# Patient Record
Sex: Female | Born: 1950 | Race: White | Hispanic: No | Marital: Married | Smoking: Never smoker
Health system: Southern US, Community
[De-identification: ages and names within clinical notes are randomized; demographics above are authoritative.]

## PROBLEM LIST (undated history)

## (undated) DIAGNOSIS — N809 Endometriosis, unspecified: Secondary | ICD-10-CM

## (undated) DIAGNOSIS — M5136 Other intervertebral disc degeneration, lumbar region: Secondary | ICD-10-CM

## (undated) DIAGNOSIS — R7303 Prediabetes: Secondary | ICD-10-CM

## (undated) DIAGNOSIS — F419 Anxiety disorder, unspecified: Secondary | ICD-10-CM

## (undated) DIAGNOSIS — M51369 Other intervertebral disc degeneration, lumbar region without mention of lumbar back pain or lower extremity pain: Secondary | ICD-10-CM

## (undated) DIAGNOSIS — F329 Major depressive disorder, single episode, unspecified: Secondary | ICD-10-CM

## (undated) DIAGNOSIS — Z9889 Other specified postprocedural states: Secondary | ICD-10-CM

## (undated) DIAGNOSIS — G588 Other specified mononeuropathies: Secondary | ICD-10-CM

## (undated) DIAGNOSIS — K219 Gastro-esophageal reflux disease without esophagitis: Secondary | ICD-10-CM

## (undated) DIAGNOSIS — M1990 Unspecified osteoarthritis, unspecified site: Secondary | ICD-10-CM

## (undated) DIAGNOSIS — F32A Depression, unspecified: Secondary | ICD-10-CM

## (undated) DIAGNOSIS — R112 Nausea with vomiting, unspecified: Secondary | ICD-10-CM

## (undated) DIAGNOSIS — G709 Myoneural disorder, unspecified: Secondary | ICD-10-CM

## (undated) DIAGNOSIS — J302 Other seasonal allergic rhinitis: Secondary | ICD-10-CM

## (undated) DIAGNOSIS — E119 Type 2 diabetes mellitus without complications: Secondary | ICD-10-CM

## (undated) DIAGNOSIS — I10 Essential (primary) hypertension: Secondary | ICD-10-CM

## (undated) HISTORY — PX: BLADDER SURGERY: SHX569

## (undated) HISTORY — PX: ABDOMINAL HYSTERECTOMY: SHX81

## (undated) HISTORY — DX: Type 2 diabetes mellitus without complications: E11.9

## (undated) HISTORY — PX: COLONOSCOPY: SHX5424

## (undated) HISTORY — PX: BACK SURGERY: SHX140

## (undated) HISTORY — PX: CHOLECYSTECTOMY: SHX55

## (undated) HISTORY — PX: TOOTH EXTRACTION: SUR596

---

## 1997-11-09 ENCOUNTER — Ambulatory Visit (HOSPITAL_COMMUNITY): Admission: RE | Admit: 1997-11-09 | Discharge: 1997-11-09 | Payer: Self-pay | Admitting: Obstetrics and Gynecology

## 2007-03-10 HISTORY — PX: NERVE SURGERY: SHX1016

## 2017-05-11 LAB — COLOGUARD: Cologuard: POSITIVE — AB

## 2017-05-26 LAB — HM COLONOSCOPY

## 2018-04-14 ENCOUNTER — Other Ambulatory Visit: Payer: Self-pay | Admitting: Orthopedic Surgery

## 2018-04-15 NOTE — Pre-Procedure Instructions (Signed)
LASKYat C Diperna  04/15/2018      [facility] - [place], [place] - [address] [place] [id number] Phone: [phone] Fax: [phone]    Your procedure is scheduled on Wednesday, February 3rd.  [facility] Admitting at 6:30 A.M.  Call this number if you have problems the morning of surgery:  [phone]   Remember:  Do not eat or drink after midnight.    Take these medicines the morning of surgery with A SIP OF WATER  DULoxetine (CYMBALTA)  gabapentin (NEURONTIN) lansoprazole (PREVACID)-if needed loratadine (CLARITIN)  tiZANidine (ZANAFLEX)-if needed.   Follow your surgeon's instructions on when to stop Asprin.  If no instructions were given by your surgeon then you will need to call the office to get those instructions.    As of today, STOP taking any Aleve, Naproxen, Ibuprofen, Motrin, Advil, Goody's, BC's, all herbal medications, fish oil, and all vitamins.   Do not wear jewelry, make-up or nail polish.  Do not wear lotions, powders, or perfumes, or deodorant.  Do not shave 48 hours prior to surgery.  Men may shave face and neck.  Do not bring valuables to the hospital.  [facility] is not responsible for any belongings or valuables.  Contacts, dentures or bridgework may not be worn into surgery.  Leave your suitcase in the car.  After surgery it may be brought to your room.  For patients admitted to the hospital, discharge time will be determined by your treatment team.  Patients discharged the day of surgery will not be allowed to drive home.   Special instructions:   [place]- Preparing For Surgery  Before surgery, you can play an important role. Because skin is not sterile, your skin needs to be as free of germs as possible. You can reduce the number of germs on your skin by washing with CHG (chlorahexidine gluconate) Soap before surgery.  CHG is an antiseptic cleaner which kills germs and bonds with the  skin to continue killing germs even after washing.    Oral Hygiene is also important to reduce your risk of infection.  Remember - BRUSH YOUR TEETH THE MORNING OF SURGERY WITH YOUR REGULAR TOOTHPASTE  Please do not use if you have an allergy to CHG or antibacterial soaps. If your skin becomes reddened/irritated stop using the CHG.  Do not shave (including legs and underarms) for at least 48 hours prior to first CHG shower. It is OK to shave your face.  Please follow these instructions carefully.   1. Shower the NIGHT BEFORE SURGERY and the MORNING OF SURGERY with CHG.   2. If you chose to wash your hair, wash your hair first as usual with your normal shampoo.  3. After you shampoo, rinse your hair and body thoroughly to remove the shampoo.  4. Use CHG as you would any other liquid soap. You can apply CHG directly to the skin and wash gently with a scrungie or a clean washcloth.   5. Apply the CHG Soap to your body ONLY FROM THE NECK DOWN.  Do not use on open wounds or open sores. Avoid contact with your eyes, ears, mouth and genitals (private parts). Wash Face and genitals (private parts)  with your normal soap.  6. Wash thoroughly, paying special attention to the area where your surgery will be performed.  7. Thoroughly rinse your body with warm water from the neck down.  8. DO NOT shower/wash  with your normal soap after using and rinsing off the CHG Soap.  9. Farrah yourself dry with a CLEAN TOWEL.  10. Wear CLEAN PAJAMAS to bed the night before surgery, wear comfortable clothes the morning of surgery  11. Place CLEAN SHEETS on your bed the night of your first shower and DO NOT SLEEP WITH PETS.    Day of Surgery:  Do not apply any deodorants/lotions.  Please wear clean clothes to the hospital/surgery center.   Remember to brush your teeth WITH YOUR REGULAR TOOTHPASTE.   Please read over the following fact sheets that you were given.

## 2018-04-18 ENCOUNTER — Other Ambulatory Visit: Payer: Self-pay

## 2018-04-18 ENCOUNTER — Encounter (HOSPITAL_COMMUNITY): Payer: Self-pay | Admitting: Anesthesiology

## 2018-04-18 ENCOUNTER — Encounter (HOSPITAL_COMMUNITY)
Admission: RE | Admit: 2018-04-18 | Discharge: 2018-04-18 | Disposition: A | Discharge status: Home / Self Care | Payer: Medicare Other | Source: Ambulatory Visit | Attending: Orthopedic Surgery | Admitting: Orthopedic Surgery

## 2018-04-18 ENCOUNTER — Encounter (HOSPITAL_COMMUNITY): Payer: Self-pay | Admitting: *Deleted

## 2018-04-18 ENCOUNTER — Encounter (HOSPITAL_COMMUNITY): Payer: Self-pay | Admitting: Vascular Surgery

## 2018-04-18 DIAGNOSIS — Z01818 Encounter for other preprocedural examination: Secondary | ICD-10-CM | POA: Insufficient documentation

## 2018-04-18 HISTORY — DX: Other seasonal allergic rhinitis: J30.2

## 2018-04-18 HISTORY — DX: Anxiety disorder, unspecified: F41.9

## 2018-04-18 HISTORY — DX: Prediabetes: R73.03

## 2018-04-18 HISTORY — DX: Myoneural disorder, unspecified: G70.9

## 2018-04-18 HISTORY — DX: Endometriosis, unspecified: N80.9

## 2018-04-18 HISTORY — DX: Gastro-esophageal reflux disease without esophagitis: K21.9

## 2018-04-18 HISTORY — DX: Other specified postprocedural states: Z98.890

## 2018-04-18 HISTORY — DX: Unspecified osteoarthritis, unspecified site: M19.90

## 2018-04-18 HISTORY — DX: Nausea with vomiting, unspecified: R11.2

## 2018-04-18 HISTORY — DX: Depression, unspecified: F32.A

## 2018-04-18 HISTORY — DX: Essential (primary) hypertension: I10

## 2018-04-18 HISTORY — DX: Major depressive disorder, single episode, unspecified: F32.9

## 2018-04-18 LAB — COMPREHENSIVE METABOLIC PANEL
ALT: 19 U/L (ref 0–44)
ANION GAP: 9 (ref 5–15)
AST: 28 U/L (ref 15–41)
Albumin: 4.2 g/dL (ref 3.5–5.0)
Alkaline Phosphatase: 72 U/L (ref 38–126)
BUN: 17 mg/dL (ref 8–23)
CO2: 30 mmol/L (ref 22–32)
Calcium: 9.7 mg/dL (ref 8.9–10.3)
Chloride: 97 mmol/L — ABNORMAL LOW (ref 98–111)
Creatinine, Ser: 1.16 mg/dL — ABNORMAL HIGH (ref 0.44–1.00)
GFR calc Af Amer: 57 mL/min — ABNORMAL LOW (ref >60)
GFR calc non Af Amer: 49 mL/min — ABNORMAL LOW (ref >60)
Glucose, Bld: 117 mg/dL — ABNORMAL HIGH (ref 70–99)
POTASSIUM: 4.2 mmol/L (ref 3.5–5.1)
Sodium: 136 mmol/L (ref 135–145)
Total Bilirubin: 0.7 mg/dL (ref 0.3–1.2)
Total Protein: 7.2 g/dL (ref 6.5–8.1)

## 2018-04-18 LAB — URINALYSIS, ROUTINE W REFLEX MICROSCOPIC
Bilirubin Urine: NEGATIVE
Glucose, UA: NEGATIVE mg/dL
Hgb urine dipstick: NEGATIVE
Ketones, ur: NEGATIVE mg/dL
Leukocytes, UA: NEGATIVE
Nitrite: NEGATIVE
PH: 7 (ref 5.0–8.0)
Protein, ur: NEGATIVE mg/dL
Specific Gravity, Urine: 1.013 (ref 1.005–1.030)

## 2018-04-18 LAB — CBC WITH DIFFERENTIAL/PLATELET
Abs Immature Granulocytes: 0.03 10*3/uL (ref 0.00–0.07)
Basophils Absolute: 0.1 10*3/uL (ref 0.0–0.1)
Basophils Relative: 1 %
EOS ABS: 0.5 10*3/uL (ref 0.0–0.5)
Eosinophils Relative: 4 %
HCT: 48 % — ABNORMAL HIGH (ref 36.0–46.0)
Hemoglobin: 14.6 g/dL (ref 12.0–15.0)
Immature Granulocytes: 0 %
LYMPHS ABS: 2.6 10*3/uL (ref 0.7–4.0)
Lymphocytes Relative: 25 %
MCH: 25.6 pg — ABNORMAL LOW (ref 26.0–34.0)
MCHC: 30.4 g/dL (ref 30.0–36.0)
MCV: 84.1 fL (ref 80.0–100.0)
Monocytes Absolute: 0.8 10*3/uL (ref 0.1–1.0)
Monocytes Relative: 7 %
Neutro Abs: 6.4 10*3/uL (ref 1.7–7.7)
Neutrophils Relative %: 63 %
Platelets: 357 10*3/uL (ref 150–400)
RBC: 5.71 MIL/uL — ABNORMAL HIGH (ref 3.87–5.11)
RDW: 13.8 % (ref 11.5–15.5)
WBC: 10.3 10*3/uL (ref 4.0–10.5)
nRBC: 0 % (ref 0.0–0.2)

## 2018-04-18 LAB — SURGICAL PCR SCREEN
MRSA, PCR: NEGATIVE
STAPHYLOCOCCUS AUREUS: POSITIVE — AB

## 2018-04-18 LAB — TYPE AND SCREEN
ABO/RH(D): A POS
ANTIBODY SCREEN: NEGATIVE

## 2018-04-18 LAB — APTT: aPTT: 35 seconds (ref 24–36)

## 2018-04-18 LAB — PROTIME-INR
INR: 1.03
Prothrombin Time: 13.4 seconds (ref 11.4–15.2)

## 2018-04-18 LAB — HEMOGLOBIN A1C
Hgb A1c MFr Bld: 6.7 % — ABNORMAL HIGH (ref 4.8–5.6)
Mean Plasma Glucose: 145.59 mg/dL

## 2018-04-18 LAB — ABO/RH: ABO/RH(D): A POS

## 2018-04-18 NOTE — Anesthesia Preprocedure Evaluation (Deleted)
Anesthesia Evaluation    Reviewed: Allergy & Precautions, Patient's Chart, lab work & pertinent test results  History of Anesthesia Complications (+) PONV  Airway        Dental   Pulmonary neg pulmonary ROS,           Cardiovascular hypertension, Pt. on medications and Pt. on home beta blockers      Neuro/Psych Anxiety Depression    GI/Hepatic Neg liver ROS, GERD  Medicated,  Endo/Other  negative endocrine ROS  Renal/GU negative Renal ROS     Musculoskeletal  (+) Arthritis ,   Abdominal   Peds  Hematology negative hematology ROS (+)   Anesthesia Other Findings   Reproductive/Obstetrics                            Lab Results  Component Value Date   WBC 10.3 04/18/2018   HGB 14.6 04/18/2018   HCT 48.0 (H) 04/18/2018   MCV 84.1 04/18/2018   PLT 357 04/18/2018   Lab Results  Component Value Date   CREATININE 1.16 (H) 04/18/2018   BUN 17 04/18/2018   NA 136 04/18/2018   K 4.2 04/18/2018   CL 97 (L) 04/18/2018   CO2 30 04/18/2018   Lab Results  Component Value Date   INR 1.03 04/18/2018     Anesthesia Physical Anesthesia Plan  ASA: II  Anesthesia Plan: General   Post-op Pain Management:    Induction: Intravenous  PONV Risk Score and Plan: 4 or greater and Ondansetron, Dexamethasone, Midazolam and Scopolamine patch - Pre-op  Airway Management Planned: Oral ETT  Additional Equipment: None  Intra-op Plan:   Post-operative Plan: Extubation in OR  Informed Consent:   Plan Discussed with: CRNA  Anesthesia Plan Comments: ([place] note written 04/18/2018 by Shonna Chock, PA-C.   Cancelled due to URI symptoms, cannot rule out flu.  )      Anesthesia Quick Evaluation

## 2018-04-18 NOTE — Pre-Procedure Instructions (Signed)
NEVA PRUE  04/18/2018      [facility] - [place], [place] - [address] [place] [address] Phone: [phone] Fax: [phone]    Your procedure is scheduled on Wednesday, February 12rd.  Report to [facility] Admitting at 6:30 A.M.  Call this number if you have problems the morning of surgery:  [phone]   Remember:  Do not eat or drink after midnight.    Take these medicines the morning of surgery with A SIP OF WATER  DULoxetine (CYMBALTA)  gabapentin (NEURONTIN) lansoprazole (PREVACID)-if needed loratadine (CLARITIN)  tiZANidine (ZANAFLEX)-if needed.   Follow your surgeon's instructions on when to stop Asprin.  If no instructions were given by your surgeon then you will need to call the office to get those instructions.    As of today, STOP taking any Aleve, Naproxen, Ibuprofen, Motrin, Advil, Goody's, BC's, all herbal medications, fish oil, and all vitamins.   Do not wear jewelry, make-up or nail polish.  Do not wear lotions, powders, or perfumes, or deodorant.  Do not shave 48 hours prior to surgery.  Men may shave face and neck.  Do not bring valuables to the hospital.  [facility] is not responsible for any belongings or valuables.  Contacts, dentures or bridgework may not be worn into surgery.  Leave your suitcase in the car.  After surgery it may be brought to your room.  For patients admitted to the hospital, discharge time will be determined by your treatment team.  Patients discharged the day of surgery will not be allowed to drive home.   Special instructions:   [place]- Preparing For Surgery  Before surgery, you can play an important role. Because skin is not sterile, your skin needs to be as free of germs as possible. You can reduce the number of germs on your skin by washing with CHG (chlorahexidine gluconate) Soap before surgery.  CHG is an antiseptic cleaner which kills germs and bonds with  the skin to continue killing germs even after washing.    Oral Hygiene is also important to reduce your risk of infection.  Remember - BRUSH YOUR TEETH THE MORNING OF SURGERY WITH YOUR REGULAR TOOTHPASTE  Please do not use if you have an allergy to CHG or antibacterial soaps. If your skin becomes reddened/irritated stop using the CHG.  Do not shave (including legs and underarms) for at least 48 hours prior to first CHG shower. It is OK to shave your face.  Please follow these instructions carefully.   1. Shower the NIGHT BEFORE SURGERY and the MORNING OF SURGERY with CHG.   2. If you chose to wash your hair, wash your hair first as usual with your normal shampoo.  3. After you shampoo, rinse your hair and body thoroughly to remove the shampoo.  4. Use CHG as you would any other liquid soap. You can apply CHG directly to the skin and wash gently with a scrungie or a clean washcloth.   5. Apply the CHG Soap to your body ONLY FROM THE NECK DOWN.  Do not use on open wounds or open sores. Avoid contact with your eyes, ears, mouth and genitals (private parts). Wash Face and genitals (private parts)  with your normal soap.  6. Wash thoroughly, paying special attention to the area where your surgery will be performed.  7. Thoroughly rinse your body with warm water from the neck down.  8. DO NOT shower/wash  with your normal soap after using and rinsing off the CHG Soap.  9. Kyna yourself dry with a CLEAN TOWEL.  10. Wear CLEAN PAJAMAS to bed the night before surgery, wear comfortable clothes the morning of surgery  11. Place CLEAN SHEETS on your bed the night of your first shower and DO NOT SLEEP WITH PETS.    Day of Surgery:  Do not apply any deodorants/lotions.  Please wear clean clothes to the hospital/surgery center.   Remember to brush your teeth WITH YOUR REGULAR TOOTHPASTE.   Please read over the following fact sheets that you were given.

## 2018-04-18 NOTE — Progress Notes (Signed)
PCP - [facility] Cardiologist - denies  Chest x-ray - not needed EKG - 04/18/18 Stress Test - denies ECHO - denies Cardiac Cath - denies  Patient stopped aspirin 7 days ago  Anesthesia review: yes patient c/o cough sinus pressure, sore throat.  Revonda Standard PA to see patient  Patient denies shortness of breath, fever, and chest pain at [place] appointment   Patient verbalized understanding of instructions that were given to them at the [facility] appointment. Patient was also instructed that they will need to review over the [place] instructions again at home before surgery.

## 2018-04-18 NOTE — Progress Notes (Addendum)
Anesthesia [place] Evaluation:  Case:  [id number] Date/Time:  04/20/18 0815   Procedure:  LEFT SIDED LUMBAR 4-5 TRANSFORAMINAL LUMBAR INTERBODY FUSION WITH INSTRUMENTATION AND ALLOGRAFT (Left )   Anesthesia type:  General   Pre-op diagnosis:  High grade spondylolisthesis with severe left-sided neuroforaminal stenosis.   Location:  [facility] OR ROOM 05 / [facility] OR   Surgeon:  Estill Bamberg, MD      DISCUSSION: Patient is a 68 year old female scheduled for the above procedure.   History includes never smoker, post-operative N/V, HTN, pre-diabetes, GERD, pudendal neuralgia (treated with Suboxone). BMI is consistent with obesity. She recently moved from [place].   Patient seen in [place] due to reports of sore throat and intermittent cough since this morning (04/18/18). She has known allergies (on Claritin AM, Zyrtec PM; allergy shot 04/17/18), but husband also with similar symptoms that started on 04/16/18. Neither have had fever. Currently patient with sore throat, clear nasal drainage, and clear sputum with occasional cough. She denied SOB, chest pain, palpitations, edema, syncope. Her Suboxone is on hold for surgery (last dose 04/17/18).   Patient does not appear ill with the exception of posterior pharyngeal erythema. No white exudates. Lungs clear. Heart RRR, no murmur noted. O2 sats 98%. Currently, her symptoms seem most consistent with pharyngitis/URI, likely of viral etiology. She is aware that should her symptoms progress or just feeling poorly in general that surgery may need to be delayed until she feels recovered--and that elective surgery should be delayed if she were to develop, fever, abnormal lung sounds, persistent cough, SOB, hypoxia. While at [place], symptoms more mild, but she will see how she does over the next day. If she is feeling poorly by tomorrow afternoon then she will notify Dr. Marshell Levan office regarding rescheduling her surgery. If symptoms stable/improved then she would be re-evaluated by her  anesthesia team on the day of surgery and definitive plan made at that time. I have discussed this with anesthesiologist Kipp Brood, MD. I spoke with Lupita Leash at Dr. Marshell Levan office, and he is also aware of patient's symptoms.    VS: BP 131/81   Pulse 79   Temp 36.8 C   Resp 20   Ht 5' 4.5" (1.638 m)   Wt 89 kg   SpO2 98%   BMI 33.17 kg/m   PROVIDERS: Sheran Spine, MD is PCP at [facility]. He prescribes her Suboxone, so she continues to see him about every three months.    LABS: Labs reviewed: Acceptable for surgery. (all labs ordered are listed, but only abnormal results are displayed)  Labs Reviewed  SURGICAL PCR SCREEN - Abnormal; Notable for the following components:      Result Value   Staphylococcus aureus POSITIVE (*)    All other components within normal limits  CBC WITH DIFFERENTIAL/PLATELET - Abnormal; Notable for the following components:   RBC 5.71 (*)    HCT 48.0 (*)    MCH 25.6 (*)    All other components within normal limits  COMPREHENSIVE METABOLIC PANEL - Abnormal; Notable for the following components:   Chloride 97 (*)    Glucose, Bld 117 (*)    Creatinine, Ser 1.16 (*)    GFR calc non Af Amer 49 (*)    GFR calc Af Amer 57 (*)    All other components within normal limits  URINALYSIS, ROUTINE W REFLEX MICROSCOPIC - Abnormal; Notable for the following components:   APPearance HAZY (*)    All other components within normal limits  HEMOGLOBIN A1C - Abnormal; Notable for the following components:   Hgb A1c MFr Bld 6.7 (*)    All other components within normal limits  APTT  PROTIME-INR  TYPE AND SCREEN  ABO/RH     IMAGES: MRI L-spine 11/24/17 [facility] Care Everywhere): IMPRESSION: 1. Diffuse lumbar spine spondylosis as described above. 2. At L4-5 there is a mild broad-based disc bulge. Severe bilateral facet arthropathy with ligamentum flavum infolding. Bilateral lateral recess stenosis, left greater than right. No right  foraminal stenosis. Moderate-severe left foraminal stenosis. Mild-moderate spinal stenosis. 3. At L2-3 there is a broad-based disc osteophyte complex. Moderate bilateral facet arthropathy. Bilateral lateral recess stenosis. No left foraminal stenosis. Moderate right foraminal stenosis. Mild spinal canal narrowing.   EKG: 04/18/18: NSR, low voltage QRS.   CV: N/A  Past Medical History:  Diagnosis Date  . Anxiety   . Arthritis   . Depression   . Endometriosis   . GERD (gastroesophageal reflux disease)   . Hypertension   . Neuromuscular disorder (HCC)    neurologia  . PONV (postoperative nausea and vomiting)   . Pre-diabetes   . Seasonal allergies     Past Surgical History:  Procedure Laterality Date  . ABDOMINAL HYSTERECTOMY    . BLADDER SURGERY     to help with UTI  . CHOLECYSTECTOMY    . COLONOSCOPY    . NERVE SURGERY     for her neuralgia - out in [place]  . TOOTH EXTRACTION      MEDICATIONS: . aspirin EC 81 MG tablet  . buprenorphine-naloxone (SUBOXONE) 8-2 mg SUBL SL tablet  . cetirizine (ZYRTEC) 10 MG tablet  . clonazePAM (KLONOPIN) 1 MG tablet  . Coenzyme Q10 (CO Q-10) 200 MG CAPS  . DULoxetine (CYMBALTA) 30 MG capsule  . estradiol (CLIMARA - DOSED IN MG/24 HR) 0.0375 mg/24hr patch  . gabapentin (NEURONTIN) 800 MG tablet  . lansoprazole (PREVACID) 30 MG capsule  . Linoleic Acid-Sunflower Oil (CLA PO)  . lisinopril (PRINIVIL,ZESTRIL) 20 MG tablet  . loratadine (CLARITIN) 10 MG tablet  . Multiple Minerals-Vitamins (CALCIUM-MAGNESIUM-ZINC-D3) TABS  . Multiple Vitamin (MULTIVITAMIN WITH MINERALS) TABS tablet  . naproxen (NAPROSYN) 500 MG tablet  . nebivolol (BYSTOLIC) 10 MG tablet  . rosuvastatin (CRESTOR) 5 MG tablet  . tiZANidine (ZANAFLEX) 2 MG tablet   No current facility-administered medications for this encounter.   ASA on hold for surgery. Last Suboxone 04/17/18.   Shonna Chock, PA-C Surgical Short Stay/Anesthesiology [facility] Phone [phone] [facility] Phone [phone] 04/18/2018 10:23 PM

## 2018-04-19 ENCOUNTER — Encounter (HOSPITAL_COMMUNITY): Payer: Self-pay | Admitting: Anesthesiology

## 2018-04-20 ENCOUNTER — Ambulatory Visit (HOSPITAL_COMMUNITY)
Admission: RE | Admit: 2018-04-20 | Discharge: 2018-04-20 | Disposition: A | Discharge status: Home / Self Care | Payer: Medicare Other | Attending: Orthopedic Surgery | Admitting: Orthopedic Surgery

## 2018-04-20 ENCOUNTER — Encounter (HOSPITAL_COMMUNITY)
Admission: RE | Disposition: A | Discharge status: Home / Self Care | Payer: Self-pay | Source: Home / Self Care | Attending: Orthopedic Surgery

## 2018-04-20 ENCOUNTER — Encounter (HOSPITAL_COMMUNITY): Payer: Self-pay

## 2018-04-20 ENCOUNTER — Other Ambulatory Visit: Payer: Self-pay

## 2018-04-20 DIAGNOSIS — M79605 Pain in left leg: Secondary | ICD-10-CM | POA: Diagnosis present

## 2018-04-20 DIAGNOSIS — Z9049 Acquired absence of other specified parts of digestive tract: Secondary | ICD-10-CM | POA: Insufficient documentation

## 2018-04-20 DIAGNOSIS — I10 Essential (primary) hypertension: Secondary | ICD-10-CM | POA: Diagnosis not present

## 2018-04-20 DIAGNOSIS — Z881 Allergy status to other antibiotic agents status: Secondary | ICD-10-CM | POA: Diagnosis not present

## 2018-04-20 DIAGNOSIS — M48061 Spinal stenosis, lumbar region without neurogenic claudication: Secondary | ICD-10-CM | POA: Insufficient documentation

## 2018-04-20 DIAGNOSIS — Z79899 Other long term (current) drug therapy: Secondary | ICD-10-CM | POA: Insufficient documentation

## 2018-04-20 DIAGNOSIS — F329 Major depressive disorder, single episode, unspecified: Secondary | ICD-10-CM | POA: Diagnosis not present

## 2018-04-20 DIAGNOSIS — M4316 Spondylolisthesis, lumbar region: Secondary | ICD-10-CM | POA: Insufficient documentation

## 2018-04-20 DIAGNOSIS — Z7982 Long term (current) use of aspirin: Secondary | ICD-10-CM | POA: Insufficient documentation

## 2018-04-20 DIAGNOSIS — M1990 Unspecified osteoarthritis, unspecified site: Secondary | ICD-10-CM | POA: Diagnosis not present

## 2018-04-20 DIAGNOSIS — Z885 Allergy status to narcotic agent status: Secondary | ICD-10-CM | POA: Diagnosis not present

## 2018-04-20 DIAGNOSIS — Z888 Allergy status to other drugs, medicaments and biological substances status: Secondary | ICD-10-CM | POA: Insufficient documentation

## 2018-04-20 DIAGNOSIS — K219 Gastro-esophageal reflux disease without esophagitis: Secondary | ICD-10-CM | POA: Insufficient documentation

## 2018-04-20 DIAGNOSIS — F419 Anxiety disorder, unspecified: Secondary | ICD-10-CM | POA: Insufficient documentation

## 2018-04-20 DIAGNOSIS — R7303 Prediabetes: Secondary | ICD-10-CM | POA: Diagnosis not present

## 2018-04-20 LAB — GLUCOSE, CAPILLARY: Glucose-Capillary: 106 mg/dL — ABNORMAL HIGH (ref 70–99)

## 2018-04-20 SURGERY — CANCELLED PROCEDURE

## 2018-04-20 MED ORDER — EPINEPHRINE PF 1 MG/ML IJ SOLN
INTRAMUSCULAR | Status: AC
  Filled 2018-04-20: qty 1

## 2018-04-20 MED ORDER — MIDAZOLAM HCL 2 MG/2ML IJ SOLN
INTRAMUSCULAR | Status: AC
  Filled 2018-04-20: qty 2

## 2018-04-20 MED ORDER — METHYLENE BLUE 0.5 % INJ SOLN
INTRAVENOUS | Status: AC
  Filled 2018-04-20: qty 10

## 2018-04-20 MED ORDER — FENTANYL CITRATE (PF) 250 MCG/5ML IJ SOLN
INTRAMUSCULAR | Status: AC
  Filled 2018-04-20: qty 5

## 2018-04-20 MED ORDER — PROPOFOL 10 MG/ML IV BOLUS
INTRAVENOUS | Status: AC
  Filled 2018-04-20: qty 20

## 2018-04-20 MED ORDER — CEFAZOLIN SODIUM-DEXTROSE 2-4 GM/100ML-% IV SOLN
2.0000 g | INTRAVENOUS | Status: DC
  Filled 2018-04-20: qty 100

## 2018-04-20 MED ORDER — THROMBIN (RECOMBINANT) 20000 UNITS EX SOLR
CUTANEOUS | Status: AC
  Filled 2018-04-20: qty 20000

## 2018-04-20 MED ORDER — POVIDONE-IODINE 7.5 % EX SOLN
Freq: Once | CUTANEOUS | Status: DC
  Filled 2018-04-20: qty 118

## 2018-04-20 MED ORDER — BUPIVACAINE HCL (PF) 0.25 % IJ SOLN
INTRAMUSCULAR | Status: AC
  Filled 2018-04-20: qty 30

## 2018-04-20 SURGICAL SUPPLY — 78 items
APL SKNCLS STERI-STRIP NONHPOA (GAUZE/BANDAGES/DRESSINGS) ×1
BENZOIN TINCTURE PRP APPL 2/3 (GAUZE/BANDAGES/DRESSINGS) ×4 IMPLANT
BLADE CLIPPER SURG (BLADE) IMPLANT
BUR PRESCISION 1.7 ELITE (BURR) ×4 IMPLANT
BUR ROUND FLUTED 5 RND (BURR) ×3 IMPLANT
BUR ROUND FLUTED 5MM RND (BURR) ×1
BUR ROUND PRECISION 4.0 (BURR) IMPLANT
BUR ROUND PRECISION 4.0MM (BURR)
BUR SABER RD CUTTING 3.0 (BURR) IMPLANT
BUR SABER RD CUTTING 3.0MM (BURR)
CARTRIDGE OIL MAESTRO DRILL (MISCELLANEOUS) ×2 IMPLANT
CLOSURE WOUND 1/2 X4 (GAUZE/BANDAGES/DRESSINGS) ×2
CONT SPEC 4OZ CLIKSEAL STRL BL (MISCELLANEOUS) ×4 IMPLANT
COVER MAYO STAND STRL (DRAPES) ×8 IMPLANT
COVER SURGICAL LIGHT HANDLE (MISCELLANEOUS) ×4 IMPLANT
COVER WAND RF STERILE (DRAPES) ×4 IMPLANT
DIFFUSER DRILL AIR PNEUMATIC (MISCELLANEOUS) ×4 IMPLANT
DRAIN CHANNEL 15F RND FF W/TCR (WOUND CARE) IMPLANT
DRAPE C-ARM 42X72 X-RAY (DRAPES) ×4 IMPLANT
DRAPE C-ARMOR (DRAPES) IMPLANT
DRAPE POUCH INSTRU U-SHP 10X18 (DRAPES) ×4 IMPLANT
DRAPE SURG 17X23 STRL (DRAPES) ×16 IMPLANT
DURAPREP 26ML APPLICATOR (WOUND CARE) ×4 IMPLANT
ELECT BLADE 4.0 EZ CLEAN MEGAD (MISCELLANEOUS) ×3
ELECT CAUTERY BLADE 6.4 (BLADE) ×4 IMPLANT
ELECT REM PT RETURN 9FT ADLT (ELECTROSURGICAL) ×3
ELECTRODE BLDE 4.0 EZ CLN MEGD (MISCELLANEOUS) ×2 IMPLANT
ELECTRODE REM PT RTRN 9FT ADLT (ELECTROSURGICAL) ×2 IMPLANT
EVACUATOR SILICONE 100CC (DRAIN) IMPLANT
FILTER STRAW FLUID ASPIR (MISCELLANEOUS) ×4 IMPLANT
GAUZE 4X4 16PLY RFD (DISPOSABLE) ×4 IMPLANT
GAUZE SPONGE 4X4 12PLY STRL (GAUZE/BANDAGES/DRESSINGS) ×4 IMPLANT
GLOVE BIO SURGEON STRL SZ7 (GLOVE) ×4 IMPLANT
GLOVE BIO SURGEON STRL SZ8 (GLOVE) ×4 IMPLANT
GLOVE BIOGEL PI IND STRL 7.0 (GLOVE) ×2 IMPLANT
GLOVE BIOGEL PI IND STRL 8 (GLOVE) ×2 IMPLANT
GLOVE BIOGEL PI INDICATOR 7.0 (GLOVE) ×2
GLOVE BIOGEL PI INDICATOR 8 (GLOVE) ×2
GOWN STRL REUS W/ TWL LRG LVL3 (GOWN DISPOSABLE) ×4 IMPLANT
GOWN STRL REUS W/ TWL XL LVL3 (GOWN DISPOSABLE) ×2 IMPLANT
GOWN STRL REUS W/TWL LRG LVL3 (GOWN DISPOSABLE) ×6
GOWN STRL REUS W/TWL XL LVL3 (GOWN DISPOSABLE) ×3
IV CATH 14GX2 1/4 (CATHETERS) ×4 IMPLANT
KIT BASIN OR (CUSTOM PROCEDURE TRAY) ×4 IMPLANT
KIT POSITION SURG JACKSON T1 (MISCELLANEOUS) ×4 IMPLANT
KIT TURNOVER KIT B (KITS) ×4 IMPLANT
MARKER SKIN DUAL TIP RULER LAB (MISCELLANEOUS) ×8 IMPLANT
NDL HYPO 25GX1X1/2 BEV (NEEDLE) ×1 IMPLANT
NDL SAFETY ECLIPSE 18X1.5 (NEEDLE) ×2 IMPLANT
NDL SPNL 18GX3.5 QUINCKE PK (NEEDLE) ×2 IMPLANT
NEEDLE 22X1 1/2 (OR ONLY) (NEEDLE) ×8 IMPLANT
NEEDLE HYPO 18GX1.5 SHARP (NEEDLE) ×3
NEEDLE HYPO 25GX1X1/2 BEV (NEEDLE) ×3 IMPLANT
NEEDLE SPNL 18GX3.5 QUINCKE PK (NEEDLE) ×6 IMPLANT
NS IRRIG 1000ML POUR BTL (IV SOLUTION) ×4 IMPLANT
OIL CARTRIDGE MAESTRO DRILL (MISCELLANEOUS) ×3
PACK LAMINECTOMY ORTHO (CUSTOM PROCEDURE TRAY) ×4 IMPLANT
PACK UNIVERSAL I (CUSTOM PROCEDURE TRAY) ×4 IMPLANT
PAD ARMBOARD 7.5X6 YLW CONV (MISCELLANEOUS) ×8 IMPLANT
PATTIES SURGICAL .5 X1 (DISPOSABLE) ×4 IMPLANT
PATTIES SURGICAL .5X1.5 (GAUZE/BANDAGES/DRESSINGS) ×4 IMPLANT
SPONGE INTESTINAL PEANUT (DISPOSABLE) ×4 IMPLANT
SPONGE SURGIFOAM ABS GEL 100 (HEMOSTASIS) ×4 IMPLANT
STRIP CLOSURE SKIN 1/2X4 (GAUZE/BANDAGES/DRESSINGS) ×6 IMPLANT
SURGIFLO W/THROMBIN 8M KIT (HEMOSTASIS) IMPLANT
SUT MNCRL AB 4-0 PS2 18 (SUTURE) ×4 IMPLANT
SUT VIC AB 0 CT1 18XCR BRD 8 (SUTURE) ×2 IMPLANT
SUT VIC AB 0 CT1 8-18 (SUTURE) ×3
SUT VIC AB 1 CT1 18XCR BRD 8 (SUTURE) ×2 IMPLANT
SUT VIC AB 1 CT1 8-18 (SUTURE) ×3
SUT VIC AB 2-0 CT2 18 VCP726D (SUTURE) ×4 IMPLANT
SYR 20CC LL (SYRINGE) ×8 IMPLANT
SYR BULB IRRIGATION 50ML (SYRINGE) ×4 IMPLANT
SYR CONTROL 10ML LL (SYRINGE) ×8 IMPLANT
SYR TB 1ML LUER SLIP (SYRINGE) ×4 IMPLANT
TRAY FOLEY MTR SLVR 16FR STAT (SET/KITS/TRAYS/PACK) ×4 IMPLANT
WATER STERILE IRR 1000ML POUR (IV SOLUTION) ×4 IMPLANT
YANKAUER SUCT BULB TIP NO VENT (SUCTIONS) ×4 IMPLANT

## 2018-04-20 NOTE — Progress Notes (Signed)
Patient this morning feels that she may have a head cold, or potentially allergies.  She did have concerns related to anesthesia and proceeding with surgery.  I did talk through her situation with Dr. Hart Rochester.  Although it seems there may not be any firm medical reason to cancel her surgery, given her concerns related to her potential head cold or allergies, we will postpone her surgery to a later date.  She will be discharged from the holding area soon, and my office will be in contact with her with regards to rescheduling her surgery.

## 2018-04-20 NOTE — H&P (Signed)
PREOPERATIVE H&P  Chief Complaint: Left leg pain  HPI: Katie Combs is a 68 y.o. female who presents with ongoing pain in the left leg  MRI reveals severe stenosis at L4/5 with a spondylilisthesis  Patient has failed multiple forms of conservative care and continues to have pain (see office notes for additional details regarding the patient's full course of treatment)  Past Medical History:  Diagnosis Date  . Anxiety   . Arthritis   . Depression   . Endometriosis   . GERD (gastroesophageal reflux disease)   . Hypertension   . Neuromuscular disorder (HCC)    neurologia  . PONV (postoperative nausea and vomiting)   . Pre-diabetes   . Seasonal allergies    Past Surgical History:  Procedure Laterality Date  . ABDOMINAL HYSTERECTOMY    . BLADDER SURGERY     to help with UTI  . CHOLECYSTECTOMY    . COLONOSCOPY    . NERVE SURGERY     for her neuralgia - out in [place]  . TOOTH EXTRACTION     Social History   Socioeconomic History  . Marital status: Married    Spouse name: Not on file  . Number of children: Not on file  . Years of education: Not on file  . Highest education level: Not on file  Occupational History  . Not on file  Social Needs  . Financial resource strain: Not on file  . Food insecurity:    Worry: Not on file    Inability: Not on file  . Transportation needs:    Medical: Not on file    Non-medical: Not on file  Tobacco Use  . Smoking status: Never Smoker  . Smokeless tobacco: Never Used  Substance and Sexual Activity  . Alcohol use: Not Currently  . Drug use: Never  . Sexual activity: Not on file  Lifestyle  . Physical activity:    Days per week: Not on file    Minutes per session: Not on file  . Stress: Not on file  Relationships  . Social connections:    Talks on phone: Not on file    Gets together: Not on file    Attends religious service: Not on file    Active member of club or organization: Not on file    Attends  meetings of clubs or organizations: Not on file    Relationship status: Not on file  Other Topics Concern  . Not on file  Social History Narrative  . Not on file   History reviewed. No pertinent family history. Allergies  Allergen Reactions  . Morphine And Related Anaphylaxis  . Macrodantin [Nitrofurantoin Macrocrystal]     Flu like symptoms  . Other Other (See Comments)    Synthetic Sutures do not heal and had to be removed   . Adhesive [Tape] Rash   Prior to Admission medications   Medication Sig Start Date End Date Taking? Authorizing Provider  aspirin EC 81 MG tablet Take 81 mg by mouth daily.   Yes [provider]  buprenorphine-naloxone (SUBOXONE) 8-2 mg SUBL SL tablet Place 1 tablet under the tongue 2 (two) times daily.   Yes [provider]  cetirizine (ZYRTEC) 10 MG tablet Take 10 mg by mouth at bedtime.   Yes [provider]  clonazePAM (KLONOPIN) 1 MG tablet Take 0.5 mg by mouth at bedtime.   Yes [provider]  Coenzyme Q10 (CO Q-10) 200 MG CAPS Take 200 mg by  mouth at bedtime.   Yes [provider]  DULoxetine (CYMBALTA) 30 MG capsule Take 30 mg by mouth daily.   Yes [provider]  estradiol (CLIMARA - DOSED IN MG/24 HR) 0.0375 mg/24hr patch Place 0.0375 mg onto the skin 2 (two) times a week.   Yes [provider]  gabapentin (NEURONTIN) 800 MG tablet Take 800-1,600 mg by mouth See admin instructions. Take 1600 mg by mouth in the morning and 800 mg at night   Yes [provider]  lansoprazole (PREVACID) 30 MG capsule Take 30 mg by mouth 2 (two) times daily as needed (heartburn).   Yes [provider]  Linoleic Acid-Sunflower Oil (CLA PO) Take 1,400 mg by mouth at bedtime.   Yes [provider]  lisinopril (PRINIVIL,ZESTRIL) 20 MG tablet Take 20 mg by mouth daily.   Yes [provider]  loratadine (CLARITIN) 10 MG tablet Take 10 mg by mouth daily.   Yes [provider]  Multiple Minerals-Vitamins (CALCIUM-MAGNESIUM-ZINC-D3) TABS Take 1 tablet by mouth at bedtime.   Yes [provider]  Multiple Vitamin (MULTIVITAMIN WITH MINERALS) TABS tablet Take 1 tablet by mouth daily.   Yes [provider]  naproxen (NAPROSYN) 500 MG tablet Take 500 mg by mouth 2 (two) times daily as needed for moderate pain.   Yes [provider]  nebivolol (BYSTOLIC) 10 MG tablet Take 5 mg by mouth at bedtime.   Yes [provider]  rosuvastatin (CRESTOR) 5 MG tablet Take 2.5 mg by mouth at bedtime.   Yes [provider]  tiZANidine (ZANAFLEX) 2 MG tablet Take 2 mg by mouth 2 (two) times daily as needed for muscle spasms.   Yes [provider]     All other systems have been reviewed and were otherwise negative with the exception of those mentioned in the HPI and as above.  Physical Exam: There were no vitals filed for this visit.  Body mass index is 33.17 kg/m.  General: Alert, no acute distress Cardiovascular: No pedal edema Respiratory: No cyanosis, no use of accessory musculature Skin: No lesions in the area of chief complaint Neurologic: Sensation intact distally Psychiatric: Patient is competent for consent with normal mood and affect Lymphatic: No axillary or cervical lymphadenopathy  MUSCULOSKELETAL: + SLR on the left  Assessment/Plan: L4/5 spondylolisthesis with severe left-sided neuroforaminal stenosis. Plan for Procedure(s): LEFT SIDED LUMBAR 4-5 TRANSFORAMINAL LUMBAR INTERBODY FUSION WITH INSTRUMENTATION AND ALLOGRAFT   Jackelyn HoehnMark L Tomoko Sandra, MD 04/20/2018 7:20 AM

## 2018-04-20 NOTE — Progress Notes (Signed)
Per Dr. Yevette Edwards, Patient does not want to move forward with surgery today due to possible cold.  Patient is to contact Dr. Marshell Levan office to reschedule.  Patient discharged home with husband and family.

## 2018-05-03 ENCOUNTER — Other Ambulatory Visit: Payer: Self-pay | Admitting: Orthopedic Surgery

## 2018-05-10 ENCOUNTER — Encounter (HOSPITAL_COMMUNITY): Payer: Self-pay | Admitting: *Deleted

## 2018-05-10 ENCOUNTER — Other Ambulatory Visit: Payer: Self-pay

## 2018-05-11 ENCOUNTER — Encounter (HOSPITAL_COMMUNITY): Payer: Self-pay | Admitting: Anesthesiology

## 2018-05-11 NOTE — Anesthesia Preprocedure Evaluation (Addendum)
Anesthesia Evaluation  Patient identified by MRN, date of birth, ID band Patient awake    Reviewed: Allergy & Precautions, NPO status , Patient's Chart, lab work & pertinent test results  History of Anesthesia Complications (+) PONV  Airway Mallampati: I  TM Distance: >3 FB Neck ROM: Full    Dental  (+) Teeth Intact, Dental Advisory Given   Pulmonary neg pulmonary ROS,    breath sounds clear to auscultation       Cardiovascular hypertension, Pt. on medications and Pt. on home beta blockers  Rhythm:Regular Rate:Normal     Neuro/Psych Anxiety Depression    GI/Hepatic Neg liver ROS, GERD  ,  Endo/Other  negative endocrine ROS  Renal/GU negative Renal ROS     Musculoskeletal  (+) Arthritis ,   Abdominal (+) + obese,   Peds  Hematology negative hematology ROS (+)   Anesthesia Other Findings   Reproductive/Obstetrics                            Anesthesia Physical Anesthesia Plan  ASA: II  Anesthesia Plan: General   Post-op Pain Management:    Induction: Intravenous  PONV Risk Score and Plan: 4 or greater and Ondansetron, Dexamethasone, Midazolam and Scopolamine patch - Pre-op  Airway Management Planned: Oral ETT  Additional Equipment: None  Intra-op Plan:   Post-operative Plan: Extubation in OR  Informed Consent: I have reviewed the patients History and Physical, chart, labs and discussed the procedure including the risks, benefits and alternatives for the proposed anesthesia with the patient or authorized representative who has indicated his/her understanding and acceptance.     Dental advisory given  Plan Discussed with: CRNA  Anesthesia Plan Comments:        Anesthesia Quick Evaluation

## 2018-05-12 ENCOUNTER — Inpatient Hospital Stay (HOSPITAL_COMMUNITY): Payer: Medicare Other

## 2018-05-12 ENCOUNTER — Inpatient Hospital Stay (HOSPITAL_COMMUNITY): Payer: Medicare Other | Admitting: Certified Registered Nurse Anesthetist

## 2018-05-12 ENCOUNTER — Inpatient Hospital Stay (HOSPITAL_COMMUNITY)
Admission: RE | Admit: 2018-05-12 | Discharge: 2018-05-13 | DRG: 455 | Disposition: A | Discharge status: Home / Self Care | Payer: Medicare Other | Attending: Orthopedic Surgery | Admitting: Orthopedic Surgery

## 2018-05-12 ENCOUNTER — Other Ambulatory Visit: Payer: Self-pay

## 2018-05-12 ENCOUNTER — Inpatient Hospital Stay (HOSPITAL_COMMUNITY)
Admission: RE | Disposition: A | Discharge status: Home / Self Care | Payer: Self-pay | Source: Home / Self Care | Attending: Orthopedic Surgery

## 2018-05-12 ENCOUNTER — Encounter (HOSPITAL_COMMUNITY): Payer: Self-pay | Admitting: *Deleted

## 2018-05-12 DIAGNOSIS — Z888 Allergy status to other drugs, medicaments and biological substances status: Secondary | ICD-10-CM

## 2018-05-12 DIAGNOSIS — J302 Other seasonal allergic rhinitis: Secondary | ICD-10-CM | POA: Diagnosis present

## 2018-05-12 DIAGNOSIS — Z9049 Acquired absence of other specified parts of digestive tract: Secondary | ICD-10-CM

## 2018-05-12 DIAGNOSIS — M48061 Spinal stenosis, lumbar region without neurogenic claudication: Secondary | ICD-10-CM | POA: Diagnosis present

## 2018-05-12 DIAGNOSIS — Z90710 Acquired absence of both cervix and uterus: Secondary | ICD-10-CM

## 2018-05-12 DIAGNOSIS — M5416 Radiculopathy, lumbar region: Secondary | ICD-10-CM | POA: Diagnosis present

## 2018-05-12 DIAGNOSIS — Z885 Allergy status to narcotic agent status: Secondary | ICD-10-CM | POA: Diagnosis not present

## 2018-05-12 DIAGNOSIS — K08409 Partial loss of teeth, unspecified cause, unspecified class: Secondary | ICD-10-CM | POA: Diagnosis present

## 2018-05-12 DIAGNOSIS — Z87440 Personal history of urinary (tract) infections: Secondary | ICD-10-CM | POA: Diagnosis not present

## 2018-05-12 DIAGNOSIS — M792 Neuralgia and neuritis, unspecified: Secondary | ICD-10-CM | POA: Diagnosis present

## 2018-05-12 DIAGNOSIS — F329 Major depressive disorder, single episode, unspecified: Secondary | ICD-10-CM | POA: Diagnosis present

## 2018-05-12 DIAGNOSIS — Z79890 Hormone replacement therapy (postmenopausal): Secondary | ICD-10-CM | POA: Diagnosis not present

## 2018-05-12 DIAGNOSIS — Z419 Encounter for procedure for purposes other than remedying health state, unspecified: Secondary | ICD-10-CM

## 2018-05-12 DIAGNOSIS — M5410 Radiculopathy, site unspecified: Secondary | ICD-10-CM | POA: Diagnosis present

## 2018-05-12 DIAGNOSIS — M1990 Unspecified osteoarthritis, unspecified site: Secondary | ICD-10-CM | POA: Diagnosis present

## 2018-05-12 DIAGNOSIS — Z87892 Personal history of anaphylaxis: Secondary | ICD-10-CM | POA: Diagnosis not present

## 2018-05-12 DIAGNOSIS — Z79899 Other long term (current) drug therapy: Secondary | ICD-10-CM

## 2018-05-12 DIAGNOSIS — K219 Gastro-esophageal reflux disease without esophagitis: Secondary | ICD-10-CM | POA: Diagnosis present

## 2018-05-12 DIAGNOSIS — Z6833 Body mass index (BMI) 33.0-33.9, adult: Secondary | ICD-10-CM | POA: Diagnosis not present

## 2018-05-12 DIAGNOSIS — R7303 Prediabetes: Secondary | ICD-10-CM | POA: Diagnosis present

## 2018-05-12 DIAGNOSIS — I10 Essential (primary) hypertension: Secondary | ICD-10-CM | POA: Diagnosis present

## 2018-05-12 DIAGNOSIS — Z7982 Long term (current) use of aspirin: Secondary | ICD-10-CM | POA: Diagnosis not present

## 2018-05-12 DIAGNOSIS — Z91048 Other nonmedicinal substance allergy status: Secondary | ICD-10-CM

## 2018-05-12 DIAGNOSIS — M4316 Spondylolisthesis, lumbar region: Secondary | ICD-10-CM | POA: Diagnosis present

## 2018-05-12 DIAGNOSIS — F419 Anxiety disorder, unspecified: Secondary | ICD-10-CM | POA: Diagnosis present

## 2018-05-12 DIAGNOSIS — E669 Obesity, unspecified: Secondary | ICD-10-CM | POA: Diagnosis present

## 2018-05-12 HISTORY — DX: Other intervertebral disc degeneration, lumbar region: M51.36

## 2018-05-12 HISTORY — PX: TRANSFORAMINAL LUMBAR INTERBODY FUSION (TLIF) WITH PEDICLE SCREW FIXATION 1 LEVEL: SHX6141

## 2018-05-12 HISTORY — DX: Other intervertebral disc degeneration, lumbar region without mention of lumbar back pain or lower extremity pain: M51.369

## 2018-05-12 LAB — CBC WITH DIFFERENTIAL/PLATELET
Abs Immature Granulocytes: 0.04 10*3/uL (ref 0.00–0.07)
Basophils Absolute: 0.1 10*3/uL (ref 0.0–0.1)
Basophils Relative: 1 %
Eosinophils Absolute: 0.3 10*3/uL (ref 0.0–0.5)
Eosinophils Relative: 3 %
HCT: 46.8 % — ABNORMAL HIGH (ref 36.0–46.0)
Hemoglobin: 14.5 g/dL (ref 12.0–15.0)
IMMATURE GRANULOCYTES: 0 %
Lymphocytes Relative: 25 %
Lymphs Abs: 2.8 10*3/uL (ref 0.7–4.0)
MCH: 25.4 pg — ABNORMAL LOW (ref 26.0–34.0)
MCHC: 31 g/dL (ref 30.0–36.0)
MCV: 82.1 fL (ref 80.0–100.0)
Monocytes Absolute: 1 10*3/uL (ref 0.1–1.0)
Monocytes Relative: 9 %
Neutro Abs: 6.7 10*3/uL (ref 1.7–7.7)
Neutrophils Relative %: 62 %
Platelets: 398 10*3/uL (ref 150–400)
RBC: 5.7 MIL/uL — ABNORMAL HIGH (ref 3.87–5.11)
RDW: 13.8 % (ref 11.5–15.5)
WBC: 10.9 10*3/uL — ABNORMAL HIGH (ref 4.0–10.5)
nRBC: 0 % (ref 0.0–0.2)

## 2018-05-12 LAB — GLUCOSE, CAPILLARY
Glucose-Capillary: 111 mg/dL — ABNORMAL HIGH (ref 70–99)
Glucose-Capillary: 147 mg/dL — ABNORMAL HIGH (ref 70–99)

## 2018-05-12 LAB — COMPREHENSIVE METABOLIC PANEL
ALT: 17 U/L (ref 0–44)
AST: 26 U/L (ref 15–41)
Albumin: 3.8 g/dL (ref 3.5–5.0)
Alkaline Phosphatase: 76 U/L (ref 38–126)
Anion gap: 13 (ref 5–15)
BUN: 13 mg/dL (ref 8–23)
CO2: 25 mmol/L (ref 22–32)
Calcium: 9.5 mg/dL (ref 8.9–10.3)
Chloride: 94 mmol/L — ABNORMAL LOW (ref 98–111)
Creatinine, Ser: 0.98 mg/dL (ref 0.44–1.00)
GFR calc Af Amer: >60 mL/min (ref >60)
GFR calc non Af Amer: >60 mL/min (ref >60)
GLUCOSE: 105 mg/dL — AB (ref 70–99)
Potassium: 3.6 mmol/L (ref 3.5–5.1)
Sodium: 132 mmol/L — ABNORMAL LOW (ref 135–145)
Total Bilirubin: 0.4 mg/dL (ref 0.3–1.2)
Total Protein: 6.7 g/dL (ref 6.5–8.1)

## 2018-05-12 LAB — PROTIME-INR
INR: 1.1 (ref 0.8–1.2)
Prothrombin Time: 13.6 seconds (ref 11.4–15.2)

## 2018-05-12 LAB — APTT: aPTT: 33 seconds (ref 24–36)

## 2018-05-12 LAB — TYPE AND SCREEN
ABO/RH(D): A POS
Antibody Screen: NEGATIVE

## 2018-05-12 SURGERY — TRANSFORAMINAL LUMBAR INTERBODY FUSION (TLIF) WITH PEDICLE SCREW FIXATION 1 LEVEL
Anesthesia: General | Laterality: Left

## 2018-05-12 MED ORDER — METHYLENE BLUE 0.5 % INJ SOLN
INTRAVENOUS | Status: DC | PRN
  Administered 2018-05-12: .05 mL via SUBMUCOSAL

## 2018-05-12 MED ORDER — FENTANYL CITRATE (PF) 250 MCG/5ML IJ SOLN
INTRAMUSCULAR | Status: DC | PRN
  Administered 2018-05-12 (×3): 50 ug via INTRAVENOUS
  Administered 2018-05-12: 100 ug via INTRAVENOUS

## 2018-05-12 MED ORDER — ACETAMINOPHEN 10 MG/ML IV SOLN
1000.0000 mg | Freq: Once | INTRAVENOUS | Status: DC | PRN
  Administered 2018-05-12: 1000 mg via INTRAVENOUS

## 2018-05-12 MED ORDER — ROSUVASTATIN CALCIUM 5 MG PO TABS
2.5000 mg | ORAL_TABLET | Freq: Every day | ORAL | Status: DC
  Administered 2018-05-12: 2.5 mg via ORAL
  Filled 2018-05-12: qty 1

## 2018-05-12 MED ORDER — ZOLPIDEM TARTRATE 5 MG PO TABS: 5.0000 mg | ORAL_TABLET | Freq: Every evening | ORAL | Status: DC | PRN

## 2018-05-12 MED ORDER — SCOPOLAMINE 1 MG/3DAYS TD PT72
MEDICATED_PATCH | TRANSDERMAL | Status: DC | PRN
  Administered 2018-05-12: 1 via TRANSDERMAL

## 2018-05-12 MED ORDER — THROMBIN (RECOMBINANT) 20000 UNITS EX SOLR
CUTANEOUS | Status: AC
  Filled 2018-05-12: qty 20000

## 2018-05-12 MED ORDER — FLEET ENEMA 7-19 GM/118ML RE ENEM: 1.0000 | ENEMA | Freq: Once | RECTAL | Status: DC | PRN

## 2018-05-12 MED ORDER — DULOXETINE HCL 30 MG PO CPEP
30.0000 mg | ORAL_CAPSULE | Freq: Every day | ORAL | Status: DC
  Administered 2018-05-13: 30 mg via ORAL
  Filled 2018-05-12 (×2): qty 1

## 2018-05-12 MED ORDER — PROPOFOL 10 MG/ML IV BOLUS
INTRAVENOUS | Status: AC
  Filled 2018-05-12: qty 20

## 2018-05-12 MED ORDER — LORATADINE 10 MG PO TABS
10.0000 mg | ORAL_TABLET | Freq: Every day | ORAL | Status: DC
  Administered 2018-05-12 – 2018-05-13 (×2): 10 mg via ORAL
  Filled 2018-05-12 (×2): qty 1

## 2018-05-12 MED ORDER — LACTATED RINGERS IV SOLN
INTRAVENOUS | Status: DC | PRN
  Administered 2018-05-12 (×2): via INTRAVENOUS

## 2018-05-12 MED ORDER — MEPERIDINE HCL 50 MG/ML IJ SOLN: 6.2500 mg | INTRAMUSCULAR | Status: DC | PRN

## 2018-05-12 MED ORDER — ESTRADIOL 0.0375 MG/24HR TD PTWK
0.0375 mg | MEDICATED_PATCH | TRANSDERMAL | Status: DC
  Administered 2018-05-12: 0.0375 mg via TRANSDERMAL
  Filled 2018-05-12: qty 1

## 2018-05-12 MED ORDER — PHENYLEPHRINE 40 MCG/ML (10ML) SYRINGE FOR IV PUSH (FOR BLOOD PRESSURE SUPPORT)
PREFILLED_SYRINGE | INTRAVENOUS | Status: AC
  Filled 2018-05-12: qty 10

## 2018-05-12 MED ORDER — HYDROMORPHONE HCL 1 MG/ML IJ SOLN
INTRAMUSCULAR | Status: AC
  Filled 2018-05-12: qty 1

## 2018-05-12 MED ORDER — CEFAZOLIN SODIUM-DEXTROSE 2-4 GM/100ML-% IV SOLN
2.0000 g | Freq: Three times a day (TID) | INTRAVENOUS | Status: AC
  Administered 2018-05-12 (×2): 2 g via INTRAVENOUS
  Filled 2018-05-12 (×2): qty 100

## 2018-05-12 MED ORDER — NEBIVOLOL HCL 5 MG PO TABS
5.0000 mg | ORAL_TABLET | Freq: Every day | ORAL | Status: DC
  Administered 2018-05-12: 5 mg via ORAL
  Filled 2018-05-12: qty 1

## 2018-05-12 MED ORDER — CEFAZOLIN SODIUM-DEXTROSE 2-4 GM/100ML-% IV SOLN
INTRAVENOUS | Status: AC
  Filled 2018-05-12: qty 100

## 2018-05-12 MED ORDER — CEFAZOLIN SODIUM-DEXTROSE 2-4 GM/100ML-% IV SOLN
2.0000 g | INTRAVENOUS | Status: AC
  Administered 2018-05-12: 2 g via INTRAVENOUS

## 2018-05-12 MED ORDER — DEXAMETHASONE SODIUM PHOSPHATE 10 MG/ML IJ SOLN
INTRAMUSCULAR | Status: AC
  Filled 2018-05-12: qty 2

## 2018-05-12 MED ORDER — KETOROLAC TROMETHAMINE 0.5 % OP SOLN
1.0000 [drp] | Freq: Three times a day (TID) | OPHTHALMIC | Status: DC | PRN
  Administered 2018-05-12: 1 [drp] via OPHTHALMIC

## 2018-05-12 MED ORDER — SODIUM CHLORIDE 0.9% FLUSH: 3.0000 mL | Freq: Two times a day (BID) | INTRAVENOUS | Status: DC

## 2018-05-12 MED ORDER — DEXAMETHASONE SODIUM PHOSPHATE 10 MG/ML IJ SOLN
INTRAMUSCULAR | Status: DC | PRN
  Administered 2018-05-12: 10 mg via INTRAVENOUS

## 2018-05-12 MED ORDER — KETOROLAC TROMETHAMINE 0.5 % OP SOLN
OPHTHALMIC | Status: AC
  Filled 2018-05-12: qty 5

## 2018-05-12 MED ORDER — ONDANSETRON HCL 4 MG/2ML IJ SOLN
INTRAMUSCULAR | Status: DC | PRN
  Administered 2018-05-12: 4 mg via INTRAVENOUS

## 2018-05-12 MED ORDER — MIDAZOLAM HCL 5 MG/5ML IJ SOLN
INTRAMUSCULAR | Status: DC | PRN
  Administered 2018-05-12: 2 mg via INTRAVENOUS

## 2018-05-12 MED ORDER — PROPOFOL 10 MG/ML IV BOLUS
INTRAVENOUS | Status: DC | PRN
  Administered 2018-05-12: 130 mg via INTRAVENOUS

## 2018-05-12 MED ORDER — EPHEDRINE 5 MG/ML INJ
INTRAVENOUS | Status: AC
  Filled 2018-05-12: qty 10

## 2018-05-12 MED ORDER — SUCCINYLCHOLINE CHLORIDE 200 MG/10ML IV SOSY
PREFILLED_SYRINGE | INTRAVENOUS | Status: AC
  Filled 2018-05-12: qty 10

## 2018-05-12 MED ORDER — OXYCODONE-ACETAMINOPHEN 5-325 MG PO TABS
1.0000 | ORAL_TABLET | ORAL | Status: DC | PRN
  Administered 2018-05-12 – 2018-05-13 (×3): 2 via ORAL
  Administered 2018-05-13: 1 via ORAL
  Filled 2018-05-12: qty 1
  Filled 2018-05-12 (×3): qty 2

## 2018-05-12 MED ORDER — SENNOSIDES-DOCUSATE SODIUM 8.6-50 MG PO TABS: 1.0000 | ORAL_TABLET | Freq: Every evening | ORAL | Status: DC | PRN

## 2018-05-12 MED ORDER — POTASSIUM CHLORIDE IN NACL 20-0.9 MEQ/L-% IV SOLN: INTRAVENOUS | Status: DC

## 2018-05-12 MED ORDER — PHENYLEPHRINE HCL 10 MG/ML IJ SOLN
INTRAMUSCULAR | Status: DC | PRN
  Administered 2018-05-12: 120 ug via INTRAVENOUS

## 2018-05-12 MED ORDER — ACETAMINOPHEN 325 MG PO TABS: 650.0000 mg | ORAL_TABLET | ORAL | Status: DC | PRN

## 2018-05-12 MED ORDER — SODIUM CHLORIDE 0.9% FLUSH: 3.0000 mL | INTRAVENOUS | Status: DC | PRN

## 2018-05-12 MED ORDER — ACETAMINOPHEN 325 MG PO TABS: 325.0000 mg | ORAL_TABLET | Freq: Once | ORAL | Status: DC

## 2018-05-12 MED ORDER — CALCIUM-MAGNESIUM-ZINC-D3 PO TABS: 1.0000 | ORAL_TABLET | Freq: Every day | ORAL | Status: DC

## 2018-05-12 MED ORDER — PANTOPRAZOLE SODIUM 20 MG PO TBEC
20.0000 mg | DELAYED_RELEASE_TABLET | Freq: Every day | ORAL | Status: DC
  Administered 2018-05-12 – 2018-05-13 (×2): 20 mg via ORAL
  Filled 2018-05-12 (×2): qty 1

## 2018-05-12 MED ORDER — DIAZEPAM 5 MG PO TABS: 5.0000 mg | ORAL_TABLET | Freq: Four times a day (QID) | ORAL | 0 refills | Status: DC | PRN

## 2018-05-12 MED ORDER — FENTANYL CITRATE (PF) 250 MCG/5ML IJ SOLN
INTRAMUSCULAR | Status: AC
  Filled 2018-05-12: qty 5

## 2018-05-12 MED ORDER — METHYLENE BLUE 0.5 % INJ SOLN
INTRAVENOUS | Status: AC
  Filled 2018-05-12: qty 10

## 2018-05-12 MED ORDER — SUGAMMADEX SODIUM 200 MG/2ML IV SOLN
INTRAVENOUS | Status: DC | PRN
  Administered 2018-05-12: 200 mg via INTRAVENOUS

## 2018-05-12 MED ORDER — PHENOL 1.4 % MT LIQD
1.0000 | OROMUCOSAL | Status: DC | PRN
  Filled 2018-05-12: qty 177

## 2018-05-12 MED ORDER — HYDROMORPHONE HCL 1 MG/ML IJ SOLN
0.2500 mg | INTRAMUSCULAR | Status: DC | PRN
  Administered 2018-05-12 (×2): 0.25 mg via INTRAVENOUS
  Administered 2018-05-12: 0.5 mg via INTRAVENOUS

## 2018-05-12 MED ORDER — DIAZEPAM 5 MG PO TABS
5.0000 mg | ORAL_TABLET | Freq: Four times a day (QID) | ORAL | Status: DC | PRN
  Administered 2018-05-12 – 2018-05-13 (×3): 5 mg via ORAL
  Filled 2018-05-12 (×2): qty 1

## 2018-05-12 MED ORDER — 0.9 % SODIUM CHLORIDE (POUR BTL) OPTIME
TOPICAL | Status: DC | PRN
  Administered 2018-05-12 (×2): 1000 mL

## 2018-05-12 MED ORDER — THROMBIN 20000 UNITS EX SOLR
CUTANEOUS | Status: DC | PRN
  Administered 2018-05-12: 20000 [IU] via TOPICAL

## 2018-05-12 MED ORDER — MENTHOL 3 MG MT LOZG: 1.0000 | LOZENGE | OROMUCOSAL | Status: DC | PRN

## 2018-05-12 MED ORDER — ACETAMINOPHEN 160 MG/5ML PO SOLN: 325.0000 mg | Freq: Once | ORAL | Status: DC

## 2018-05-12 MED ORDER — ALUM & MAG HYDROXIDE-SIMETH 200-200-20 MG/5ML PO SUSP: 30.0000 mL | Freq: Four times a day (QID) | ORAL | Status: DC | PRN

## 2018-05-12 MED ORDER — POVIDONE-IODINE 7.5 % EX SOLN: Freq: Once | CUTANEOUS | Status: DC

## 2018-05-12 MED ORDER — BUPIVACAINE-EPINEPHRINE 0.25% -1:200000 IJ SOLN
INTRAMUSCULAR | Status: DC | PRN
  Administered 2018-05-12: 20 mL
  Administered 2018-05-12: 10 mL

## 2018-05-12 MED ORDER — OXYCODONE-ACETAMINOPHEN 5-325 MG PO TABS: 1.0000 | ORAL_TABLET | ORAL | 0 refills | Status: DC | PRN

## 2018-05-12 MED ORDER — LIDOCAINE HCL (CARDIAC) PF 100 MG/5ML IV SOSY
PREFILLED_SYRINGE | INTRAVENOUS | Status: DC | PRN
  Administered 2018-05-12: 40 mg via INTRAVENOUS

## 2018-05-12 MED ORDER — DIAZEPAM 5 MG PO TABS
ORAL_TABLET | ORAL | Status: AC
  Filled 2018-05-12: qty 1

## 2018-05-12 MED ORDER — BUPIVACAINE-EPINEPHRINE (PF) 0.25% -1:200000 IJ SOLN
INTRAMUSCULAR | Status: AC
  Filled 2018-05-12: qty 30

## 2018-05-12 MED ORDER — ROCURONIUM BROMIDE 50 MG/5ML IV SOSY
PREFILLED_SYRINGE | INTRAVENOUS | Status: AC
  Filled 2018-05-12: qty 5

## 2018-05-12 MED ORDER — GABAPENTIN 800 MG PO TABS
1600.0000 mg | ORAL_TABLET | Freq: Every day | ORAL | Status: DC
  Filled 2018-05-12: qty 2

## 2018-05-12 MED ORDER — SODIUM CHLORIDE 0.9 % IV SOLN: 250.0000 mL | INTRAVENOUS | Status: DC

## 2018-05-12 MED ORDER — DIPHENHYDRAMINE HCL 50 MG/ML IJ SOLN
INTRAMUSCULAR | Status: DC | PRN
  Administered 2018-05-12: 12.5 mg via INTRAVENOUS

## 2018-05-12 MED ORDER — LORATADINE 10 MG PO TABS: 10.0000 mg | ORAL_TABLET | Freq: Every day | ORAL | Status: DC

## 2018-05-12 MED ORDER — BISACODYL 5 MG PO TBEC: 5.0000 mg | DELAYED_RELEASE_TABLET | Freq: Every day | ORAL | Status: DC | PRN

## 2018-05-12 MED ORDER — BSS IO SOLN: 15.0000 mL | Freq: Once | INTRAOCULAR | Status: DC

## 2018-05-12 MED ORDER — ONDANSETRON HCL 4 MG/2ML IJ SOLN
INTRAMUSCULAR | Status: AC
  Filled 2018-05-12: qty 2

## 2018-05-12 MED ORDER — ONDANSETRON HCL 4 MG PO TABS: 4.0000 mg | ORAL_TABLET | Freq: Four times a day (QID) | ORAL | Status: DC | PRN

## 2018-05-12 MED ORDER — CALCIUM CARBONATE-VITAMIN D 500-200 MG-UNIT PO TABS
1.0000 | ORAL_TABLET | Freq: Every day | ORAL | Status: DC
  Administered 2018-05-12: 1 via ORAL
  Filled 2018-05-12: qty 1

## 2018-05-12 MED ORDER — DOCUSATE SODIUM 100 MG PO CAPS
100.0000 mg | ORAL_CAPSULE | Freq: Two times a day (BID) | ORAL | Status: DC
  Administered 2018-05-12 – 2018-05-13 (×3): 100 mg via ORAL
  Filled 2018-05-12 (×3): qty 1

## 2018-05-12 MED ORDER — SODIUM CHLORIDE 0.9 % IV SOLN
INTRAVENOUS | Status: DC | PRN
  Administered 2018-05-12: 30 ug/min via INTRAVENOUS

## 2018-05-12 MED ORDER — ACETAMINOPHEN 650 MG RE SUPP: 650.0000 mg | RECTAL | Status: DC | PRN

## 2018-05-12 MED ORDER — GABAPENTIN 400 MG PO CAPS
800.0000 mg | ORAL_CAPSULE | Freq: Every day | ORAL | Status: DC
  Administered 2018-05-12: 800 mg via ORAL
  Filled 2018-05-12: qty 2

## 2018-05-12 MED ORDER — PROMETHAZINE HCL 25 MG/ML IJ SOLN: 6.2500 mg | INTRAMUSCULAR | Status: DC | PRN

## 2018-05-12 MED ORDER — TRIAMTERENE-HCTZ 37.5-25 MG PO TABS
1.0000 | ORAL_TABLET | Freq: Every day | ORAL | Status: DC
  Administered 2018-05-12 – 2018-05-13 (×2): 1 via ORAL
  Filled 2018-05-12 (×2): qty 1

## 2018-05-12 MED ORDER — LISINOPRIL 20 MG PO TABS
20.0000 mg | ORAL_TABLET | Freq: Every day | ORAL | Status: DC
  Administered 2018-05-12 – 2018-05-13 (×2): 20 mg via ORAL
  Filled 2018-05-12 (×2): qty 1

## 2018-05-12 MED ORDER — ACETAMINOPHEN 10 MG/ML IV SOLN
INTRAVENOUS | Status: AC
  Filled 2018-05-12: qty 100

## 2018-05-12 MED ORDER — CLONAZEPAM 0.5 MG PO TABS
0.5000 mg | ORAL_TABLET | Freq: Every day | ORAL | Status: DC
  Administered 2018-05-12: 0.5 mg via ORAL
  Filled 2018-05-12: qty 1

## 2018-05-12 MED ORDER — ROCURONIUM BROMIDE 100 MG/10ML IV SOLN
INTRAVENOUS | Status: DC | PRN
  Administered 2018-05-12: 30 mg via INTRAVENOUS
  Administered 2018-05-12: 50 mg via INTRAVENOUS
  Administered 2018-05-12: 20 mg via INTRAVENOUS

## 2018-05-12 MED ORDER — EPHEDRINE SULFATE 50 MG/ML IJ SOLN
INTRAMUSCULAR | Status: DC | PRN
  Administered 2018-05-12 (×2): 5 mg via INTRAVENOUS

## 2018-05-12 MED ORDER — LACTATED RINGERS IV SOLN: INTRAVENOUS | Status: DC

## 2018-05-12 MED ORDER — PROPOFOL 500 MG/50ML IV EMUL
INTRAVENOUS | Status: DC | PRN
  Administered 2018-05-12: 30 ug/kg/min via INTRAVENOUS

## 2018-05-12 MED ORDER — ONDANSETRON HCL 4 MG/2ML IJ SOLN: 4.0000 mg | Freq: Four times a day (QID) | INTRAMUSCULAR | Status: DC | PRN

## 2018-05-12 MED ORDER — MIDAZOLAM HCL 2 MG/2ML IJ SOLN
INTRAMUSCULAR | Status: AC
  Filled 2018-05-12: qty 2

## 2018-05-12 MED ORDER — BUPIVACAINE LIPOSOME 1.3 % IJ SUSP
20.0000 mL | Freq: Once | INTRAMUSCULAR | Status: DC
  Filled 2018-05-12: qty 20

## 2018-05-12 MED ORDER — LIDOCAINE 2% (20 MG/ML) 5 ML SYRINGE
INTRAMUSCULAR | Status: AC
  Filled 2018-05-12: qty 5

## 2018-05-12 MED ORDER — BUPIVACAINE LIPOSOME 1.3 % IJ SUSP
INTRAMUSCULAR | Status: DC | PRN
  Administered 2018-05-12: 20 mL

## 2018-05-12 SURGICAL SUPPLY — 96 items
APL SKNCLS STERI-STRIP NONHPOA (GAUZE/BANDAGES/DRESSINGS) ×1
BENZOIN TINCTURE PRP APPL 2/3 (GAUZE/BANDAGES/DRESSINGS) ×3 IMPLANT
BLADE CLIPPER SURG (BLADE) ×2 IMPLANT
BUR PRESCISION 1.7 ELITE (BURR) ×3 IMPLANT
BUR ROUND FLUTED 5 RND (BURR) ×2 IMPLANT
BUR ROUND FLUTED 5MM RND (BURR) ×1
BUR ROUND PRECISION 4.0 (BURR) IMPLANT
BUR ROUND PRECISION 4.0MM (BURR)
BUR SABER RD CUTTING 3.0 (BURR) IMPLANT
BUR SABER RD CUTTING 3.0MM (BURR)
CAGE CONCORDE BULLET 11X12X27 (Cage) ×3 IMPLANT
CAGE SPNL 5D BLT NOSE 27X11X12 (Cage) IMPLANT
CARTRIDGE OIL MAESTRO DRILL (MISCELLANEOUS) ×1 IMPLANT
CLOSURE STERI-STRIP 1/2X4 (GAUZE/BANDAGES/DRESSINGS) ×1
CLOSURE WOUND 1/2 X4 (GAUZE/BANDAGES/DRESSINGS) ×2
CLSR STERI-STRIP ANTIMIC 1/2X4 (GAUZE/BANDAGES/DRESSINGS) ×1 IMPLANT
CONT SPEC 4OZ CLIKSEAL STRL BL (MISCELLANEOUS) ×3 IMPLANT
COVER BACK TABLE 60X90IN (DRAPES) ×3 IMPLANT
COVER MAYO STAND STRL (DRAPES) ×6 IMPLANT
COVER SURGICAL LIGHT HANDLE (MISCELLANEOUS) ×3 IMPLANT
COVER WAND RF STERILE (DRAPES) ×3 IMPLANT
DIFFUSER DRILL AIR PNEUMATIC (MISCELLANEOUS) ×3 IMPLANT
DRAIN CHANNEL 15F RND FF W/TCR (WOUND CARE) IMPLANT
DRAPE C-ARM 42X72 X-RAY (DRAPES) ×3 IMPLANT
DRAPE C-ARMOR (DRAPES) IMPLANT
DRAPE POUCH INSTRU U-SHP 10X18 (DRAPES) ×3 IMPLANT
DRAPE SURG 17X23 STRL (DRAPES) ×12 IMPLANT
DURAPREP 26ML APPLICATOR (WOUND CARE) ×3 IMPLANT
ELECT BLADE 4.0 EZ CLEAN MEGAD (MISCELLANEOUS) ×3
ELECT CAUTERY BLADE 6.4 (BLADE) ×3 IMPLANT
ELECT REM PT RETURN 9FT ADLT (ELECTROSURGICAL) ×3
ELECTRODE BLDE 4.0 EZ CLN MEGD (MISCELLANEOUS) ×1 IMPLANT
ELECTRODE REM PT RTRN 9FT ADLT (ELECTROSURGICAL) ×1 IMPLANT
EVACUATOR SILICONE 100CC (DRAIN) IMPLANT
FEE INTRAOP MONITOR IMPULS NCS (MISCELLANEOUS) IMPLANT
FILTER STRAW FLUID ASPIR (MISCELLANEOUS) ×3 IMPLANT
GAUZE 4X4 16PLY RFD (DISPOSABLE) ×3 IMPLANT
GAUZE SPONGE 4X4 12PLY STRL (GAUZE/BANDAGES/DRESSINGS) ×3 IMPLANT
GLOVE BIO SURGEON STRL SZ 6.5 (GLOVE) ×4 IMPLANT
GLOVE BIO SURGEON STRL SZ7 (GLOVE) ×5 IMPLANT
GLOVE BIO SURGEON STRL SZ8 (GLOVE) ×3 IMPLANT
GLOVE BIO SURGEONS STRL SZ 6.5 (GLOVE) ×4
GLOVE BIOGEL PI IND STRL 7.0 (GLOVE) ×1 IMPLANT
GLOVE BIOGEL PI IND STRL 8 (GLOVE) ×1 IMPLANT
GLOVE BIOGEL PI INDICATOR 7.0 (GLOVE) ×2
GLOVE BIOGEL PI INDICATOR 8 (GLOVE) ×2
GLOVE SURG SS PI 7.0 STRL IVOR (GLOVE) ×6 IMPLANT
GOWN STRL REUS W/ TWL LRG LVL3 (GOWN DISPOSABLE) ×2 IMPLANT
GOWN STRL REUS W/ TWL XL LVL3 (GOWN DISPOSABLE) ×1 IMPLANT
GOWN STRL REUS W/TWL LRG LVL3 (GOWN DISPOSABLE) ×9
GOWN STRL REUS W/TWL XL LVL3 (GOWN DISPOSABLE) ×3
INTRAOP MONITOR FEE IMPULS NCS (MISCELLANEOUS) ×1
INTRAOP MONITOR FEE IMPULSE (MISCELLANEOUS) ×2
IV CATH 14GX2 1/4 (CATHETERS) ×3 IMPLANT
KIT BASIN OR (CUSTOM PROCEDURE TRAY) ×3 IMPLANT
KIT POSITION SURG JACKSON T1 (MISCELLANEOUS) ×3 IMPLANT
KIT TURNOVER KIT B (KITS) ×3 IMPLANT
MARKER SKIN DUAL TIP RULER LAB (MISCELLANEOUS) ×6 IMPLANT
MIX DBX 10CC 35% BONE (Bone Implant) ×2 IMPLANT
NDL HYPO 25GX1X1/2 BEV (NEEDLE) ×1 IMPLANT
NDL SAFETY ECLIPSE 18X1.5 (NEEDLE) ×1 IMPLANT
NDL SPNL 18GX3.5 QUINCKE PK (NEEDLE) ×2 IMPLANT
NEEDLE 22X1 1/2 (OR ONLY) (NEEDLE) ×6 IMPLANT
NEEDLE HYPO 18GX1.5 SHARP (NEEDLE) ×3
NEEDLE HYPO 25GX1X1/2 BEV (NEEDLE) ×3 IMPLANT
NEEDLE SPNL 18GX3.5 QUINCKE PK (NEEDLE) ×6 IMPLANT
NS IRRIG 1000ML POUR BTL (IV SOLUTION) ×3 IMPLANT
OIL CARTRIDGE MAESTRO DRILL (MISCELLANEOUS) ×3
PACK LAMINECTOMY ORTHO (CUSTOM PROCEDURE TRAY) ×3 IMPLANT
PACK UNIVERSAL I (CUSTOM PROCEDURE TRAY) ×3 IMPLANT
PAD ARMBOARD 7.5X6 YLW CONV (MISCELLANEOUS) ×6 IMPLANT
PATTIES SURGICAL .5 X1 (DISPOSABLE) ×3 IMPLANT
PATTIES SURGICAL .5X1.5 (GAUZE/BANDAGES/DRESSINGS) ×3 IMPLANT
PUTTY BONE DBX 5CC MIX (Putty) ×2 IMPLANT
ROD PRE BENT EXP 40MM (Rod) ×2 IMPLANT
ROD PRE BENT EXPEDIUM 35MM (Rod) ×2 IMPLANT
SCREW SET SINGLE INNER (Screw) ×8 IMPLANT
SCREW VIPER CORT FIX 6X35 (Screw) ×8 IMPLANT
SPONGE INTESTINAL PEANUT (DISPOSABLE) ×3 IMPLANT
SPONGE SURGIFOAM ABS GEL 100 (HEMOSTASIS) ×3 IMPLANT
STRIP CLOSURE SKIN 1/2X4 (GAUZE/BANDAGES/DRESSINGS) ×4 IMPLANT
SURGIFLO W/THROMBIN 8M KIT (HEMOSTASIS) IMPLANT
SUT MNCRL AB 4-0 PS2 18 (SUTURE) ×3 IMPLANT
SUT VIC AB 0 CT1 18XCR BRD 8 (SUTURE) ×1 IMPLANT
SUT VIC AB 0 CT1 8-18 (SUTURE) ×3
SUT VIC AB 1 CT1 18XCR BRD 8 (SUTURE) ×1 IMPLANT
SUT VIC AB 1 CT1 8-18 (SUTURE) ×3
SUT VIC AB 2-0 CT2 18 VCP726D (SUTURE) ×3 IMPLANT
SYR 20CC LL (SYRINGE) ×6 IMPLANT
SYR BULB IRRIGATION 50ML (SYRINGE) ×3 IMPLANT
SYR CONTROL 10ML LL (SYRINGE) ×6 IMPLANT
SYR TB 1ML LUER SLIP (SYRINGE) ×3 IMPLANT
TAPE CLOTH SURG 4X10 WHT LF (GAUZE/BANDAGES/DRESSINGS) ×2 IMPLANT
TRAY FOLEY MTR SLVR 16FR STAT (SET/KITS/TRAYS/PACK) ×3 IMPLANT
WATER STERILE IRR 1000ML POUR (IV SOLUTION) ×3 IMPLANT
YANKAUER SUCT BULB TIP NO VENT (SUCTIONS) ×3 IMPLANT

## 2018-05-12 NOTE — H&P (Signed)
PREOPERATIVE H&P  Chief Complaint: Left leg pain  HPI: JALEESE Combs is a 68 y.o. female who presents with ongoing pain in the left leg x 1 year  MRI reveals severe NF stenosis at L4/5 with a spondylolisthesis noted at this level  Patient has failed multiple forms of conservative care and continues to have pain (see office notes for additional details regarding the patient's full course of treatment)  Past Medical History:  Diagnosis Date  . Anxiety   . Arthritis   . DDD (degenerative disc disease), lumbar   . Depression   . Endometriosis   . GERD (gastroesophageal reflux disease)   . Hypertension   . Neuromuscular disorder (HCC)    pendulum neuralgia  . PONV (postoperative nausea and vomiting)   . Pre-diabetes   . Seasonal allergies    Past Surgical History:  Procedure Laterality Date  . ABDOMINAL HYSTERECTOMY    . BLADDER SURGERY     to help with UTI  . CHOLECYSTECTOMY    . COLONOSCOPY    . NERVE SURGERY  2009   for her neuralgia - out in [place]  . TOOTH EXTRACTION     Social History   Socioeconomic History  . Marital status: Married    Spouse name: Not on file  . Number of children: Not on file  . Years of education: Not on file  . Highest education level: Not on file  Occupational History  . Not on file  Social Needs  . Financial resource strain: Not on file  . Food insecurity:    Worry: Not on file    Inability: Not on file  . Transportation needs:    Medical: Not on file    Non-medical: Not on file  Tobacco Use  . Smoking status: Never Smoker  . Smokeless tobacco: Never Used  Substance and Sexual Activity  . Alcohol use: Not Currently  . Drug use: Never  . Sexual activity: Not on file  Lifestyle  . Physical activity:    Days per week: Not on file    Minutes per session: Not on file  . Stress: Not on file  Relationships  . Social connections:    Talks on phone: Not on file    Gets together: Not on file    Attends religious  service: Not on file    Active member of club or organization: Not on file    Attends meetings of clubs or organizations: Not on file    Relationship status: Not on file  Other Topics Concern  . Not on file  Social History Narrative  . Not on file   History reviewed. No pertinent family history. Allergies  Allergen Reactions  . Morphine And Related Anaphylaxis  . Macrodantin [Nitrofurantoin Macrocrystal]     Flu like symptoms  . Other Other (See Comments)    Synthetic Sutures do not heal and had to be removed   . Adhesive [Tape] Rash   Prior to Admission medications   Medication Sig Start Date End Date Taking? Authorizing Provider  cetirizine (ZYRTEC) 10 MG tablet Take 10 mg by mouth at bedtime.   Yes [provider]  clonazePAM (KLONOPIN) 1 MG tablet Take 0.5 mg by mouth at bedtime.   Yes [provider]  Coenzyme Q10 (CO Q-10) 200 MG CAPS Take 200 mg by mouth at bedtime.   Yes [provider]  DULoxetine (CYMBALTA) 30 MG capsule Take 30 mg by mouth daily.   Yes [provider]  gabapentin (NEURONTIN) 800 MG tablet Take 800-1,600 mg by mouth See admin instructions. Take 1600 mg by mouth in the morning and 800 mg at night   Yes [provider]  lansoprazole (PREVACID) 30 MG capsule Take 30 mg by mouth 2 (two) times daily as needed (heartburn).   Yes [provider]  Linoleic Acid-Sunflower Oil (CLA PO) Take 1,400 mg by mouth at bedtime.   Yes [provider]  lisinopril (PRINIVIL,ZESTRIL) 20 MG tablet Take 20 mg by mouth daily.   Yes [provider]  loratadine (CLARITIN) 10 MG tablet Take 10 mg by mouth daily.   Yes [provider]  Multiple Minerals-Vitamins (CALCIUM-MAGNESIUM-ZINC-D3) TABS Take 1 tablet by mouth at bedtime.   Yes [provider]  Multiple Vitamin (MULTIVITAMIN WITH MINERALS) TABS tablet Take 1 tablet by mouth daily.   Yes [provider]  naproxen (NAPROSYN) 500 MG  tablet Take 500 mg by mouth 2 (two) times daily as needed for moderate pain.   Yes [provider]  nebivolol (BYSTOLIC) 10 MG tablet Take 5 mg by mouth at bedtime.   Yes [provider]  rosuvastatin (CRESTOR) 5 MG tablet Take 2.5 mg by mouth at bedtime.   Yes [provider]  tiZANidine (ZANAFLEX) 2 MG tablet Take 2 mg by mouth 2 (two) times daily as needed for muscle spasms.   Yes [provider]  triamterene-hydrochlorothiazide (DYAZIDE) 37.5-25 MG capsule Take 1 capsule by mouth daily.   Yes [provider]  aspirin EC 81 MG tablet Take 81 mg by mouth daily.    [provider]  buprenorphine-naloxone (SUBOXONE) 8-2 mg SUBL SL tablet Place 1 tablet under the tongue 2 (two) times daily.    [provider]  estradiol (CLIMARA - DOSED IN MG/24 HR) 0.0375 mg/24hr patch Place 0.0375 mg onto the skin 2 (two) times a week.    [provider]     All other systems have been reviewed and were otherwise negative with the exception of those mentioned in the HPI and as above.  Physical Exam: Vitals:   05/12/18 0551  BP: (!) 166/84  Pulse: 73  Resp: 20  Temp: 98.7 F (37.1 C)  SpO2: 96%    Body mass index is 33.16 kg/m.  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

## 2018-05-12 NOTE — Progress Notes (Signed)
Pt voided upon arrival, unable to provide urine sample at this time.

## 2018-05-12 NOTE — Progress Notes (Signed)
Orthopedic Tech Progress Note Patient Details:  Katie Combs 1950/11/20 [id number]  Patient ID: Katie Combs, female   DOB: [birth year], 68 y.o.   MRN: [id number] Called order in to [employer]  Trinna Post 05/12/2018, 3:06 PM

## 2018-05-12 NOTE — Anesthesia Procedure Notes (Signed)
Procedure Name: Intubation Date/Time: 05/12/2018 7:46 AM Performed by: Shirlyn Goltz, CRNA Pre-anesthesia Checklist: Patient identified, Emergency Drugs available, Suction available and Patient being monitored Patient Re-evaluated:Patient Re-evaluated prior to induction Oxygen Delivery Method: Circle system utilized Preoxygenation: Pre-oxygenation with 100% oxygen Induction Type: IV induction Ventilation: Mask ventilation without difficulty Laryngoscope Size: Mac and 3 Grade View: Grade III Tube type: Oral Tube size: 7.0 mm Number of attempts: 1 Airway Equipment and Method: Stylet Placement Confirmation: ETT inserted through vocal cords under direct vision,  positive ETCO2 and breath sounds checked- equal and bilateral Secured at: 20 cm Tube secured with: Tape Dental Injury: Teeth and Oropharynx as per pre-operative assessment

## 2018-05-12 NOTE — Transfer of Care (Signed)
Immediate Anesthesia Transfer of Care Note  Patient: Katie Combs  Procedure(s) Performed: LEFT-SIDED LUMBAR 4-5 TRANSFORAMINAL LUMBAR INTERBODY FUSION WITH INSTRUMENTATION AND ALLOGRAFT (Left )  Patient Location: PACU  Anesthesia Type:General  Level of Consciousness: drowsy  Airway & Oxygen Therapy: Patient Spontanous Breathing and Patient connected to nasal cannula oxygen  Post-op Assessment: Report given to RN and Post -op Vital signs reviewed and stable  Post vital signs: Reviewed and stable  Last Vitals:  Vitals Value Taken Time  BP    Temp    Pulse    Resp    SpO2      Last Pain:  Vitals:   05/12/18 0625  TempSrc:   PainSc: 5       Patients Stated Pain Goal: 3 (05/12/18 6378)  Complications: No apparent anesthesia complications

## 2018-05-12 NOTE — Op Note (Addendum)
PATIENT NAME: Katie Combs   MEDICAL RECORD NO.:   [id number]    PHYSICIAN:  Estill Bamberg, MD      DATE OF BIRTH: 11-08-1950   DATE OF PROCEDURE: 05/12/2018                               OPERATIVE REPORT     PREOPERATIVE DIAGNOSES: 1. Left-sided lumbar radiculopathy. 2. L4-5 spinal stenosis. 3. High grade 1 L4-5 spondylolisthesis   POSTOPERATIVE DIAGNOSES: 1. Left-sided lumbar radiculopathy. 2. L4-5 spinal stenosis. 3. High grade 1 L4-5 spondylolisthesis   PROCEDURES: 1. L4/5 decompression 2. left-sided L4-5 transforaminal lumbar interbody fusion. 3. right-sided L4-5 posterolateral fusion. 4. Insertion of interbody device x1 (61mm Concorde intervertebral spacer). 5. Placement of segmental posterior instrumentation L4, L5 bilaterally  6. Use of local autograft - DBX-mix 7. Use of morselized allograft - Vivigen 8. Intraoperative use of fluoroscopy.   SURGEON:  Estill Bamberg, MD.   ASSISTANTJason Coop, PA-C.   ANESTHESIA:  General endotracheal anesthesia.   COMPLICATIONS:  None.   DISPOSITION:  Stable.   ESTIMATED BLOOD LOSS:  100cc   INDICATIONS FOR SURGERY:  Briefly,  Katie Combs is a pleasant 68 year old female who did present to me with severe and ongoing pain in the left leg x 1 year. I did feel that the symptoms were secondary to the findings noted above.   The patient failed conservative care and did wish to proceed with the procedure  noted above.    OPERATIVE DETAILS:  On 05/12/2018, the patient was brought to surgery and general endotracheal anesthesia was administered.  The patient was placed prone on a well-padded flat Jackson bed with a spinal frame.  Antibiotics were given and a time-out procedure was performed. The back was prepped and draped in the usual fashion.  A midline incision was made overlying the L4-5 intervertebral spaces.  The fascia was incised at the midline.  The paraspinal musculature was bluntly swept laterally.  Anatomic landmarks  for the pedicles were exposed. Using fluoroscopy, I did cannulate the L4 and L5 pedicles bilaterally, using a medial to lateral cortical trajectory technique.  At this point, 6 x 35 mm screws were placed into the right pedicles, and a 40 mm rod was placed into the tulip heads of the screw, and caps were also placed.  Distraction was then applied across the L4-5 intervertebral space, and the caps were then provisionally tightened.  On the left side, bone wax was placed into the cannulated pedicle holes.  I then proceeded with the decompressive aspect of the procedure at the L4-5 level.  A facetectomy was performed bilaterally at L4-5, decompressing the L4-5 intervertebral space.  I was very pleased with the decompression.  Of note, there was noted to be very severe compression of the exiting left L4 nerve, and this was entirely decompressed. With an assistant holding medial retraction of the traversing left L5 nerve, I did perform an annulotomy at the posterolateral aspect of the L4-5 intervertebral space.  I then used a series of curettes and pituitary rongeurs to perform a thorough and complete intervertebral diskectomy.  The intervertebral space was then liberally packed with autograft as well as allograft in the form of DBX-mix, as was the appropriate-sized intervertebral spacer (12 mm, lordotic).  The spacer was then tamped into position in the usual fashion.  I was very pleased with the press-fit of the spacer.  I then placed 6 mm  screws on the left at L4 and L5. A 35-mm rod was then placed and caps were placed. The distraction was then released on the contralateral side.  All caps were then locked.  The wound was copiously irrigated with a total of approximately 3 L prior to placing the bone graft.  Additional autograft and allograft was then packed into the posterolateral gutter on the right side to help aid in the success of the fusion.  The wound was  explored for any undue bleeding and  there was no substantial bleeding encountered.  Gel-Foam was placed over the laminectomy site.  The wound was then closed in layers using #1 Vicryl followed by 2-0 Vicryl, followed by 4-0 Monocryl.  Benzoin and Steri-Strips were applied followed by sterile dressing.    Of note, did use triggered EMG to test the screws on the left, and there was no screw the tested below 20 mA. There was no sustained abnormal EMG activity noted throughout the surgery.   Of note, Jason Coop was my assistant throughout surgery, and did aid in retraction, suctioning, and closure.       Estill Bamberg, MD

## 2018-05-12 NOTE — Anesthesia Postprocedure Evaluation (Addendum)
Anesthesia Post Note  Patient: Katie Combs  Procedure(s) Performed: LEFT-SIDED LUMBAR 4-5 TRANSFORAMINAL LUMBAR INTERBODY FUSION WITH INSTRUMENTATION AND ALLOGRAFT (Left )     Patient location during evaluation: PACU Anesthesia Type: General Level of consciousness: awake and alert Pain management: pain level controlled Vital Signs Assessment: post-procedure vital signs reviewed and stable Respiratory status: spontaneous breathing, nonlabored ventilation, respiratory function stable and patient connected to nasal cannula oxygen Cardiovascular status: blood pressure returned to baseline and stable Postop Assessment: no apparent nausea or vomiting Comments: Pt complaint of bilateral eye pain. - Vision intact bilaterally.  - Motor intact bilaterally. - Some redness of the scleral bilaterally  Drops and irrigation administered in the PACU.   L eye discomfort nearly resolved, R eye discomfort improved but still present. Vision remains intact bilaterally. Will continue drops and irrigation overnight. Will consult ophthalmology if symptoms not showing continued improvement overnight.      Last Vitals:  Vitals:   05/12/18 1355 05/12/18 1400  BP: 135/68   Pulse: 91 91  Resp: 16 14  Temp:  36.9 C  SpO2: 96% 93%    Last Pain:  Vitals:   05/12/18 1341  TempSrc:   PainSc: 4                  Shelton Silvas

## 2018-05-13 MED ORDER — GABAPENTIN 400 MG PO CAPS: 1600.0000 mg | ORAL_CAPSULE | Freq: Every day | ORAL | Status: DC

## 2018-05-13 MED FILL — Sodium Chloride Irrigation Soln 0.9%: Qty: 500 | Status: AC

## 2018-05-13 MED FILL — Thrombin (Recombinant) For Soln 20000 Unit: CUTANEOUS | Qty: 1 | Status: AC

## 2018-05-13 MED FILL — Heparin Sodium (Porcine) Inj 1000 Unit/ML: INTRAMUSCULAR | Qty: 30 | Status: AC

## 2018-05-13 MED FILL — Sodium Chloride IV Soln 0.9%: INTRAVENOUS | Qty: 1000 | Status: AC

## 2018-05-13 NOTE — Evaluation (Signed)
Occupational Therapy Evaluation Patient Details Name: Katie Combs MRN: [id number] DOB: 06-Jun-1950 Today's Date: 05/13/2018    History of Present Illness Pt is a 68 y.o. s/p L4-5 decompression and TLIF on 05/12/18. PMH includes HTN, DDD, anxiety, depression.    Clinical Impression   Pt is functioning at a supervision level. All education completed with pt verbalizing understanding. No further OT needs.    Follow Up Recommendations  No OT follow up    Equipment Recommendations  None recommended by OT    Recommendations for Other Services       Precautions / Restrictions Precautions Precautions: Back Precaution Booklet Issued: Yes (comment) Precaution Comments: reviewed back precautions related to ADL, IADL and mobility Required Braces or Orthoses: Spinal Brace Spinal Brace: Thoracolumbosacral orthotic;Applied in sitting position Restrictions Weight Bearing Restrictions: No      Mobility Bed Mobility Overal bed mobility: Needs Assistance Bed Mobility: Rolling;Sit to Sidelying Rolling: Supervision       Sit to sidelying: Min assist General bed mobility comments: min assist for LEs into bed, cues for log roll technique  Transfers Overall transfer level: Needs assistance Equipment used: Rolling walker (2 wheeled) Transfers: Sit to/from Stand Sit to Stand: Supervision         General transfer comment: cues for hand placement    Balance Overall balance assessment: Needs assistance   Sitting balance-Leahy Scale: Good       Standing balance-Leahy Scale: Fair Standing balance comment: at sink                           ADL either performed or assessed with clinical judgement   ADL                                         General ADL Comments: Pt is functioning at supervision level. Educated in compensatory strategies for ADL and IADL, use of toilet riser and safe footwear.     Vision Patient Visual Report: No change from  baseline       Perception     Praxis      Pertinent Vitals/Pain Pain Assessment: Faces Faces Pain Scale: Hurts little more Pain Location: back Pain Descriptors / Indicators: Sore;Discomfort Pain Intervention(s): Monitored during session;Repositioned     Hand Dominance Right   Extremity/Trunk Assessment Upper Extremity Assessment Upper Extremity Assessment: Overall WFL for tasks assessed   Lower Extremity Assessment Lower Extremity Assessment: Defer to PT evaluation       Communication Communication Communication: No difficulties   Cognition Arousal/Alertness: Awake/alert Behavior During Therapy: WFL for tasks assessed/performed Overall Cognitive Status: Within Functional Limits for tasks assessed                                     General Comments  Son and husband present    Exercises     Shoulder Instructions      Home Living Family/patient expects to be discharged to:: Private residence Living Arrangements: Spouse/significant other Available Help at Discharge: Family;Available 24 hours/day Type of Home: House Home Access: Stairs to enter [employer] of Steps: 1 Entrance Stairs-Rails: None Home Layout: One level     Bathroom Shower/Tub: Producer, television/film/video: Standard     Home Equipment: Shower seat;Toilet riser  Prior Functioning/Environment Level of Independence: Independent                 OT Problem List: Pain;Impaired balance (sitting and/or standing)      OT Treatment/Interventions:      OT Goals(Current goals can be found in the care plan section)    OT Frequency:     Barriers to D/C:            Co-evaluation              AM-PAC OT "6 Clicks" Daily Activity     Outcome Measure Help from another person eating meals?: None Help from another person taking care of personal grooming?: None Help from another person toileting, which includes using toliet, bedpan, or urinal?:  None Help from another person bathing (including washing, rinsing, drying)?: None Help from another person to put on and taking off regular upper body clothing?: None Help from another person to put on and taking off regular lower body clothing?: None 6 Click Score: 24   End of Session Equipment Utilized During Treatment: Gait belt;Rolling walker;Back brace  Activity Tolerance: Patient tolerated treatment well Patient left: in bed;with call bell/phone within reach;with family/visitor present  OT Visit Diagnosis: Pain;Other abnormalities of gait and mobility (R26.89)                Time: 8337-4451 OT Time Calculation (min): 33 min Charges:  OT General Charges $OT Visit: 1 Visit OT Evaluation $OT Eval Low Complexity: 1 Low OT Treatments $Self Care/Home Management : 8-22 mins  Martie Round, OTR/L Acute Rehabilitation Services Pager: [phone] Office: [phone]  Evern Bio 05/13/2018, 9:36 AM

## 2018-05-13 NOTE — Progress Notes (Signed)
    Patient doing well  Had left eye irritation since PACU, but has been improving Leg pain resolved   Physical Exam: Vitals:   05/12/18 2325 05/13/18 0350  BP: (!) 123/53 123/68  Pulse: 87 96  Resp: 18 20  Temp: 98.7 F (37.1 C) 98.5 F (36.9 C)  SpO2: 95% 98%    Dressing in place NVI  POD #1 s/p L4/5 decompression and fusion, doing very well  - up with PT/OT, encourage ambulation - Percocet for pain, Valium for muscle spasms - d/c home today with f/u in 2 weeks

## 2018-05-13 NOTE — Progress Notes (Signed)
Follow up re: Post Op Eye Discomfort  Pt doing well this morning. Vision intact. No irritation or pain at present time. Periodic "eye irrigation and rest helped tremendously."  Pt was informed she had a corneal abrasion which I instructed was highly unlikely due to her presentation. Pt also told we "placed lube or glue and she had an allergic reaction" in her eyes. I also instructed her we did not place lube or glue in her eyes and her symptoms were not consistent with an allergic reaction.  Ms. Teuber was very appreciative of my education and looking forward to going home today.   Kaylyn Layer [place], MD, [facility] Anesthesiology

## 2018-05-13 NOTE — Addendum Note (Signed)
Addendum  created 05/13/18 1054 by Shelton Silvas, MD   Clinical Note Signed

## 2018-05-13 NOTE — Evaluation (Signed)
Physical Therapy Evaluation & Discharge Patient Details Name: Katie Combs MRN: [id number] DOB: 05-05-1950 Today's Date: 05/13/2018   History of Present Illness  Pt is a 68 y.o. s/p L4-5 decompression and TLIF on 05/12/18. PMH includes HTN, DDD, anxiety, depression.   Clinical Impression  Patient evaluated by Physical Therapy with no further acute PT needs identified. PTA indep and lives with husband. Today, pt ambulating well with supervision; stability improved with RW, recommend intermittent use of RW at home. Educ on brace application, precautions, positioning, and importance of mobility. All education has been completed and the patient has no further questions. Acute PT is signing off. Thank you for this referral.    Follow Up Recommendations No PT follow up;Supervision for mobility/OOB    Equipment Recommendations  None recommended by PT    Recommendations for Other Services OT consult     Precautions / Restrictions Precautions Precautions: Back Precaution Booklet Issued: Yes (comment) Required Braces or Orthoses: Spinal Brace Spinal Brace: Applied in sitting position;Applied in standing position;Thoracolumbosacral orthotic Restrictions Weight Bearing Restrictions: No      Mobility  Bed Mobility               General bed mobility comments: Received sitting EOB. Reviewed log roll technique for supine<>sit; pt states she was able to complete this with nursing staff  Transfers Overall transfer level: Needs assistance Equipment used: None Transfers: Sit to/from Stand Sit to Stand: Supervision         General transfer comment: Reaching for UE support on furniture to steady; supervision for safety  Ambulation/Gait Ambulation/Gait assistance: Supervision;Min guard Gait Distance (Feet): 500 Feet Assistive device: 1 person hand held assist;Rolling walker (2 wheeled)   Gait velocity: Decreased Gait velocity interpretation: 1.31 - 2.62 ft/sec, indicative of limited  community ambulator General Gait Details: Initial ambulation with HHA and pt also reaching to hand rail for support, min guard for balance. Educ on RW use. Stability much improved with RW, supervision for safety  Stairs            Wheelchair Mobility    Modified Rankin (Stroke Patients Only)       Balance Overall balance assessment: Needs assistance   Sitting balance-Leahy Scale: Good       Standing balance-Leahy Scale: Fair Standing balance comment: Stability improved with UE support                             Pertinent Vitals/Pain Pain Assessment: Faces Faces Pain Scale: Hurts a little bit Pain Location: Lumbar incision Pain Descriptors / Indicators: Sore;Discomfort Pain Intervention(s): Limited activity within patient's tolerance    Home Living Family/patient expects to be discharged to:: Private residence Living Arrangements: Spouse/significant other Available Help at Discharge: Family;Available 24 hours/day Type of Home: House Home Access: Stairs to enter Entrance Stairs-Rails: None Entrance Stairs-Number of Steps: 1 Home Layout: One level Home Equipment: Shower seat      Prior Function Level of Independence: Independent               Hand Dominance        Extremity/Trunk Assessment   Upper Extremity Assessment Upper Extremity Assessment: Overall WFL for tasks assessed    Lower Extremity Assessment Lower Extremity Assessment: Overall WFL for tasks assessed       Communication   Communication: No difficulties  Cognition Arousal/Alertness: Awake/alert Behavior During Therapy: WFL for tasks assessed/performed Overall Cognitive Status: Within Functional Limits for tasks assessed  General Comments General comments (skin integrity, edema, etc.): Son and husband present    Exercises     Assessment/Plan    PT Assessment Patent does not need any further PT services   PT Problem List         PT Treatment Interventions      PT Goals (Current goals can be found in the Care Plan section)  Acute Rehab PT Goals PT Goal Formulation: All assessment and education complete, DC therapy    Frequency     Barriers to discharge        Co-evaluation               AM-PAC PT "6 Clicks" Mobility  Outcome Measure Help needed turning from your back to your side while in a flat bed without using bedrails?: None Help needed moving from lying on your back to sitting on the side of a flat bed without using bedrails?: None Help needed moving to and from a bed to a chair (including a wheelchair)?: A Little Help needed standing up from a chair using your arms (e.g., wheelchair or bedside chair)?: A Little Help needed to walk in hospital room?: A Little Help needed climbing 3-5 steps with a railing? : A Little 6 Click Score: 20    End of Session Equipment Utilized During Treatment: Back brace Activity Tolerance: Patient tolerated treatment well Patient left: with call bell/phone within reach;with family/visitor present(seated EOB) Nurse Communication: Mobility status PT Visit Diagnosis: Other abnormalities of gait and mobility (R26.89)    Time: 9774-1423 PT Time Calculation (min) (ACUTE ONLY): 17 min   Charges:   PT Evaluation $PT Eval Low Complexity: 1 Low        Ina Homes, PT, DPT Acute Rehabilitation Services  Pager [phone] Office [phone]  Malachy Chamber 05/13/2018, 8:43 AM

## 2018-05-13 NOTE — Progress Notes (Signed)
Patient is discharged from room 3C05 at this time. Alert and in stable condition. IV site d/c'd and instructions read to patient and spouse with understanding verbalized. Left unit via wheelchair with all belongings at side. 

## 2018-05-16 ENCOUNTER — Encounter (HOSPITAL_COMMUNITY): Payer: Self-pay | Admitting: Orthopedic Surgery

## 2018-05-26 NOTE — Discharge Summary (Signed)
Patient ID: Katie Combs MRN: [id number] DOB/AGE: 03/16/1950 68 y.o.  Admit date: 05/12/2018 Discharge date: 05/13/2018  Admission Diagnoses:  Active Problems:   Radiculopathy   Discharge Diagnoses:  Same  Past Medical History:  Diagnosis Date  . Anxiety   . Arthritis   . DDD (degenerative disc disease), lumbar   . Depression   . Endometriosis   . GERD (gastroesophageal reflux disease)   . Hypertension   . Neuromuscular disorder (HCC)    pendulum neuralgia  . PONV (postoperative nausea and vomiting)   . Pre-diabetes   . Seasonal allergies     Surgeries: Procedure(s): LEFT-SIDED LUMBAR 4-5 TRANSFORAMINAL LUMBAR INTERBODY FUSION WITH INSTRUMENTATION AND ALLOGRAFT on 05/12/2018   Consultants: None  Discharged Condition: Improved  Hospital Course: Katie Combs is an 68 y.o. female who was admitted 05/12/2018 for operative treatment of radiculopathy. Patient has severe unremitting pain that affects sleep, daily activities, and work/hobbies. After pre-op clearance the patient was taken to the operating room on 05/12/2018 and underwent  Procedure(s): LEFT-SIDED LUMBAR 4-5 TRANSFORAMINAL LUMBAR INTERBODY FUSION WITH INSTRUMENTATION AND ALLOGRAFT.    Patient was given perioperative antibiotics:  Anti-infectives (From admission, onward)   Start     Dose/Rate Route Frequency Ordered Stop   05/12/18 1445  ceFAZolin (ANCEF) IVPB 2g/100 mL premix     2 g 200 mL/hr over 30 Minutes Intravenous Every 8 hours 05/12/18 1433 05/13/18 0835   05/12/18 0615  ceFAZolin (ANCEF) IVPB 2g/100 mL premix     2 g 200 mL/hr over 30 Minutes Intravenous On call to O.R. 05/12/18 0602 05/12/18 0819   05/12/18 0609  ceFAZolin (ANCEF) 2-4 GM/100ML-% IVPB    Note to Pharmacy:  Garen Lah   : cabinet override      05/12/18 0609 05/12/18 0749       Patient was given sequential compression devices, early ambulation to prevent DVT.  Patient benefited maximally from hospital stay and there were no  complications.    Recent vital signs: BP (!) 142/94 (BP Location: Right Arm)   Pulse 88   Temp 98.3 F (36.8 C) (Oral)   Resp 18   Ht 5' 4.5" (1.638 m)   Wt 89 kg   SpO2 97%   BMI 33.16 kg/m    Discharge Medications:   Allergies as of 05/13/2018      Reactions   Morphine And Related Anaphylaxis   Macrodantin [nitrofurantoin Macrocrystal]    Flu like symptoms   Other Other (See Comments)   Synthetic Sutures do not heal and had to be removed    Adhesive [tape] Rash      Medication List    STOP taking these medications   buprenorphine-naloxone 8-2 mg Subl SL tablet Commonly known as:  SUBOXONE     TAKE these medications   aspirin EC 81 MG tablet Take 81 mg by mouth daily.   Calcium-Magnesium-Zinc-D3 Tabs Take 1 tablet by mouth at bedtime.   cetirizine 10 MG tablet Commonly known as:  ZYRTEC Take 10 mg by mouth at bedtime.   CLA PO Take 1,400 mg by mouth at bedtime.   clonazePAM 1 MG tablet Commonly known as:  KLONOPIN Take 0.5 mg by mouth at bedtime.   Co Q-10 200 MG Caps Take 200 mg by mouth at bedtime.   diazepam 5 MG tablet Commonly known as:  VALIUM Take 1 tablet (5 mg total) by mouth every 6 (six) hours as needed for muscle spasms.   DULoxetine 30 MG capsule  Commonly known as:  CYMBALTA Take 30 mg by mouth daily.   estradiol 0.0375 mg/24hr patch Commonly known as:  CLIMARA - Dosed in mg/24 hr Place 0.0375 mg onto the skin 2 (two) times a week.   gabapentin 800 MG tablet Commonly known as:  NEURONTIN Take 800-1,600 mg by mouth See admin instructions. Take 1600 mg by mouth in the morning and 800 mg at night   lansoprazole 30 MG capsule Commonly known as:  PREVACID Take 30 mg by mouth 2 (two) times daily as needed (heartburn).   lisinopril 20 MG tablet Commonly known as:  PRINIVIL,ZESTRIL Take 20 mg by mouth daily.   loratadine 10 MG tablet Commonly known as:  CLARITIN Take 10 mg by mouth daily.   multivitamin with minerals Tabs tablet  Take 1 tablet by mouth daily.   nebivolol 10 MG tablet Commonly known as:  BYSTOLIC Take 5 mg by mouth at bedtime.   oxyCODONE-acetaminophen 5-325 MG tablet Commonly known as:  PERCOCET/ROXICET Take 1-2 tablets by mouth every 4 (four) hours as needed for moderate pain or severe pain.   rosuvastatin 5 MG tablet Commonly known as:  CRESTOR Take 2.5 mg by mouth at bedtime.   triamterene-hydrochlorothiazide 37.5-25 MG capsule Commonly known as:  DYAZIDE Take 1 capsule by mouth daily.       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 RSTO performed by Roderick Pee RT-R Fluoro Time is 45 secs. EXAM: LUMBAR SPINE - 2-3 VIEW COMPARISON:  05/12/2018 MRI 11/23/2017 FINDINGS: Two intraoperative views demonstrate posterior lumbar fusion at L4-L5 with pedicle screws and posterior fusion rods. Interbody spacers at L4-L5. mild grade 1 anterolisthesis of L4 on L5. IMPRESSION: Posterior lumbar fusion L4-L5. Electronically Signed   By: Genevive Bi M.D.   On: 05/12/2018 13:11   Dg Lumbar Spine 1 View  Result Date: 05/12/2018 CLINICAL DATA:  L4-L5 fusion. EXAM: DG C-ARM 61-120 MIN; LUMBAR SPINE - 1 VIEW COMPARISON:  MRI of the lumbosacral spine 11/23/2017 FINDINGS: Frontal and lateral intraoperative fluoroscopic views of the lumbosacral spine demonstrate transforaminal L4-L5 lumbar interbody fusion, and placement of intervening disc spacer. The alignment is stable. IMPRESSION: Intraoperative fluoroscopic views from transforaminal L4-L5 interbody fusion. Electronically Signed   By: Ted Mcalpine M.D.   On: 05/12/2018 13:13   Dg C-arm 1-60 Min  Result Date: 05/12/2018 CLINICAL DATA:  L4-L5 fusion. EXAM: DG C-ARM 61-120 MIN; LUMBAR SPINE - 1 VIEW COMPARISON:  MRI of the lumbosacral spine 11/23/2017  FINDINGS: Frontal and lateral intraoperative fluoroscopic views of the lumbosacral spine demonstrate transforaminal L4-L5 lumbar interbody fusion, and placement of intervening disc spacer. The alignment is stable. IMPRESSION: Intraoperative fluoroscopic views from transforaminal L4-L5 interbody fusion. Electronically Signed   By: Ted Mcalpine M.D.   On: 05/12/2018 13:13    Disposition:   POD #1 s/p L4/5 decompression and fusion, doing very well  - up with PT/OT, encourage ambulation - Percocet for pain, Valium for muscle spasms - d/c home today with f/u in 2 weeks  Signed: Georga Bora 05/26/2018, 12:06 PM

## 2018-07-26 ENCOUNTER — Emergency Department (HOSPITAL_COMMUNITY)
Admission: EM | Admit: 2018-07-26 | Discharge: 2018-07-27 | Disposition: A | Discharge status: Home / Self Care | Payer: Medicare Other | Attending: Emergency Medicine | Admitting: Emergency Medicine

## 2018-07-26 ENCOUNTER — Encounter (HOSPITAL_COMMUNITY): Payer: Self-pay

## 2018-07-26 ENCOUNTER — Other Ambulatory Visit: Payer: Self-pay

## 2018-07-26 ENCOUNTER — Emergency Department (HOSPITAL_COMMUNITY): Payer: Medicare Other

## 2018-07-26 DIAGNOSIS — Z79899 Other long term (current) drug therapy: Secondary | ICD-10-CM | POA: Diagnosis not present

## 2018-07-26 DIAGNOSIS — Z7982 Long term (current) use of aspirin: Secondary | ICD-10-CM | POA: Diagnosis not present

## 2018-07-26 DIAGNOSIS — I10 Essential (primary) hypertension: Secondary | ICD-10-CM | POA: Insufficient documentation

## 2018-07-26 DIAGNOSIS — R42 Dizziness and giddiness: Secondary | ICD-10-CM | POA: Insufficient documentation

## 2018-07-26 DIAGNOSIS — T50905A Adverse effect of unspecified drugs, medicaments and biological substances, initial encounter: Secondary | ICD-10-CM | POA: Diagnosis not present

## 2018-07-26 LAB — COMPREHENSIVE METABOLIC PANEL
ALT: 42 U/L (ref 0–44)
AST: 54 U/L — ABNORMAL HIGH (ref 15–41)
Albumin: 3.8 g/dL (ref 3.5–5.0)
Alkaline Phosphatase: 126 U/L (ref 38–126)
Anion gap: 10 (ref 5–15)
BUN: 21 mg/dL (ref 8–23)
CO2: 31 mmol/L (ref 22–32)
Calcium: 10.3 mg/dL (ref 8.9–10.3)
Chloride: 89 mmol/L — ABNORMAL LOW (ref 98–111)
Creatinine, Ser: 1.4 mg/dL — ABNORMAL HIGH (ref 0.44–1.00)
GFR calc Af Amer: 45 mL/min — ABNORMAL LOW (ref >60)
GFR calc non Af Amer: 39 mL/min — ABNORMAL LOW (ref >60)
Glucose, Bld: 138 mg/dL — ABNORMAL HIGH (ref 70–99)
Potassium: 3.9 mmol/L (ref 3.5–5.1)
Sodium: 130 mmol/L — ABNORMAL LOW (ref 135–145)
Total Bilirubin: 0.5 mg/dL (ref 0.3–1.2)
Total Protein: 7.6 g/dL (ref 6.5–8.1)

## 2018-07-26 LAB — CBC
HCT: 42 % (ref 36.0–46.0)
Hemoglobin: 13.3 g/dL (ref 12.0–15.0)
MCH: 27.1 pg (ref 26.0–34.0)
MCHC: 31.7 g/dL (ref 30.0–36.0)
MCV: 85.5 fL (ref 80.0–100.0)
Platelets: 330 10*3/uL (ref 150–400)
RBC: 4.91 MIL/uL (ref 3.87–5.11)
RDW: 14.8 % (ref 11.5–15.5)
WBC: 12.5 10*3/uL — ABNORMAL HIGH (ref 4.0–10.5)
nRBC: 0 % (ref 0.0–0.2)

## 2018-07-26 LAB — DIFFERENTIAL
Abs Immature Granulocytes: 0.07 10*3/uL (ref 0.00–0.07)
Basophils Absolute: 0.1 10*3/uL (ref 0.0–0.1)
Basophils Relative: 1 %
Eosinophils Absolute: 0.2 10*3/uL (ref 0.0–0.5)
Eosinophils Relative: 1 %
Immature Granulocytes: 1 %
Lymphocytes Relative: 17 %
Lymphs Abs: 2.2 10*3/uL (ref 0.7–4.0)
Monocytes Absolute: 1.1 10*3/uL — ABNORMAL HIGH (ref 0.1–1.0)
Monocytes Relative: 9 %
Neutro Abs: 9 10*3/uL — ABNORMAL HIGH (ref 1.7–7.7)
Neutrophils Relative %: 71 %

## 2018-07-26 LAB — URINALYSIS, ROUTINE W REFLEX MICROSCOPIC
Bilirubin Urine: NEGATIVE
Glucose, UA: NEGATIVE mg/dL
Hgb urine dipstick: NEGATIVE
Ketones, ur: NEGATIVE mg/dL
Leukocytes,Ua: NEGATIVE
Nitrite: NEGATIVE
Protein, ur: NEGATIVE mg/dL
Specific Gravity, Urine: 1.014 (ref 1.005–1.030)
pH: 6 (ref 5.0–8.0)

## 2018-07-26 LAB — PROTIME-INR
INR: 1 (ref 0.8–1.2)
Prothrombin Time: 13.5 seconds (ref 11.4–15.2)

## 2018-07-26 LAB — APTT: aPTT: 35 seconds (ref 24–36)

## 2018-07-26 NOTE — ED Notes (Signed)
EKG completed in triage - did not cross over. Signed by MD

## 2018-07-26 NOTE — ED Triage Notes (Signed)
Patient has had a recent medication change from Oxycodone to Suboxone last Monday (took suboxone from 2014 until march 2020 when she had back surgery then started oxy). States that her dr wanted to her to transition back  from oxy to suboxone and has since had dizziness and difficulty formulating thoughts.

## 2018-07-27 MED ORDER — SODIUM CHLORIDE 0.9 % IV BOLUS
1000.0000 mL | Freq: Once | INTRAVENOUS | Status: AC
  Administered 2018-07-27: 03:00:00 1000 mL via INTRAVENOUS

## 2018-07-27 NOTE — ED Provider Notes (Signed)
[facility] EMERGENCY DEPARTMENT Provider Note   CSN: [id number] Arrival date & time: 07/26/18  2038    History   Chief Complaint Chief Complaint  Patient presents with  . Dizziness    HPI Katie Combs is a 68 y.o. female.     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  Patient reports that she feels like she is having an adverse reaction to medication.  Patient recently had a surgery and was placed on oxycodone.  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  She was transitioning back to Suboxone when she began to have episodes of dizziness.  Dizziness was worse upon standing.  Her husband would take her blood pressure and it was low.  She did continue to take her blood pressure medications.  She initially had her Suboxone halved and then quartered but continued to have episodes of dizziness and low blood pressure.  She denies any recent fevers.  No strokelike symptoms.  Denies headache, chest pain, shortness of breath, abdominal pain, nausea, vomiting.  He does report that she has had some confusion during these episodes.  Past Medical History:  Diagnosis Date  . Anxiety   . Arthritis   . DDD (degenerative disc disease), lumbar   . Depression   . Endometriosis   . GERD (gastroesophageal reflux disease)   . Hypertension   . Neuromuscular disorder (HCC)    pendulum neuralgia  . PONV (postoperative nausea and vomiting)   . Pre-diabetes   . Seasonal allergies     Patient Active Problem List   Diagnosis Date Noted  . Radiculopathy 05/12/2018    Past Surgical History:  Procedure Laterality Date  . ABDOMINAL HYSTERECTOMY    . BLADDER SURGERY     to help with UTI  . CHOLECYSTECTOMY    . COLONOSCOPY    . NERVE SURGERY  2009   for her neuralgia - out in [place]  . TOOTH EXTRACTION    . TRANSFORAMINAL LUMBAR INTERBODY FUSION (TLIF) WITH  PEDICLE SCREW FIXATION 1 LEVEL Left 05/12/2018   Procedure: LEFT-SIDED LUMBAR 4-5 TRANSFORAMINAL LUMBAR INTERBODY FUSION WITH INSTRUMENTATION AND ALLOGRAFT;  Surgeon: Estill Bamberg, MD;  Location: [facility] OR;  Service: Orthopedics;  Laterality: Left;     OB History   No obstetric history on file.      Home Medications    Prior to Admission medications   Medication Sig Start Date End Date Taking? Authorizing Provider  aspirin EC 81 MG tablet Take 81 mg by mouth daily.   Yes [provider]  cetirizine (ZYRTEC) 10 MG tablet Take 10 mg by mouth at bedtime.   Yes [provider]  clonazePAM (KLONOPIN) 1 MG tablet Take 0.5 mg by mouth at bedtime.   Yes [provider]  Coenzyme Q10 (CO Q-10) 200 MG CAPS Take 200 mg by mouth at bedtime.   Yes [provider]  DULoxetine (CYMBALTA) 30 MG capsule Take 30 mg by mouth daily.   Yes [provider]  estradiol (CLIMARA - DOSED IN MG/24 HR) 0.0375 mg/24hr patch Place 0.0375 mg onto the skin 2 (two) times a week.   Yes [provider]  gabapentin (NEURONTIN) 800 MG tablet Take 800-1,600 mg by mouth See admin instructions. Take 1600 mg by mouth in the morning and 800 mg at night   Yes [provider]  lansoprazole (PREVACID) 30 MG capsule Take 30 mg  by mouth 2 (two) times daily as needed (heartburn).   Yes [provider]  lisinopril (PRINIVIL,ZESTRIL) 20 MG tablet Take 20 mg by mouth daily.   Yes [provider]  loratadine (CLARITIN) 10 MG tablet Take 10 mg by mouth daily.   Yes [provider]  Multiple Vitamin (MULTIVITAMIN WITH MINERALS) TABS tablet Take 1 tablet by mouth daily.   Yes [provider]  nebivolol (BYSTOLIC) 10 MG tablet Take 5 mg by mouth at bedtime.   Yes [provider]  rosuvastatin (CRESTOR) 5 MG tablet Take 2.5 mg by mouth at bedtime.   Yes [provider]  SUBOXONE 8-2 MG FILM Place 0.25 Film under the tongue 2 (two)  times a day.  05/06/18  Yes [provider]  triamterene-hydrochlorothiazide (DYAZIDE) 37.5-25 MG capsule Take 1 capsule by mouth daily.   Yes [provider]  diazepam (VALIUM) 5 MG tablet Take 1 tablet (5 mg total) by mouth every 6 (six) hours as needed for muscle spasms. Patient not taking: Reported on 07/27/2018 05/12/18   Estill Bamberg, MD  oxyCODONE-acetaminophen (PERCOCET/ROXICET) 5-325 MG tablet Take 1-2 tablets by mouth every 4 (four) hours as needed for moderate pain or severe pain. Patient not taking: Reported on 07/27/2018 05/12/18   Estill Bamberg, MD    Family History History reviewed. No pertinent family history.  Social History Social History   Tobacco Use  . Smoking status: Never Smoker  . Smokeless tobacco: Never Used  Substance Use Topics  . Alcohol use: Not Currently  . Drug use: Never     Allergies   Morphine and related; Macrodantin [nitrofurantoin macrocrystal]; Other; and Adhesive [tape]   Review of Systems Review of Systems  Constitutional: Negative for fever.  Respiratory: Negative for shortness of breath.   Cardiovascular: Negative for chest pain.  Gastrointestinal: Negative for abdominal pain, nausea and vomiting.  Genitourinary: Negative for dysuria.  Neurological: Positive for dizziness and light-headedness. Negative for seizures, speech difficulty and weakness.  Psychiatric/Behavioral: Positive for confusion.  All other systems reviewed and are negative.    Physical Exam Updated Vital Signs BP (!) 101/55   Pulse 83   Temp 99.1 F (37.3 C) (Oral)   Resp 12   SpO2 93%   Physical Exam Vitals signs and nursing note reviewed.  Constitutional:      Appearance: She is well-developed. She is obese.  HENT:     Head: Normocephalic and atraumatic.  Eyes:     Pupils: Pupils are equal, round, and reactive to light.  Neck:     Musculoskeletal: Neck supple.  Cardiovascular:     Rate and Rhythm: Normal rate and regular rhythm.      Heart sounds: Normal heart sounds.  Pulmonary:     Effort: Pulmonary effort is normal. No respiratory distress.     Breath sounds: No wheezing.  Abdominal:     General: Bowel sounds are normal.     Palpations: Abdomen is soft.  Musculoskeletal:     Right lower leg: No edema.     Left lower leg: No edema.  Skin:    General: Skin is warm and dry.  Neurological:     Mental Status: She is alert and oriented to person, place, and time.     Comments: Fluent speech, cranial nerves II through XII intact, 5 out of 5 strength in all 4 extremities, no dysmetria to finger-nose-finger  Psychiatric:        Mood and Affect: Mood normal.  ED Treatments / Results  Labs (all labs ordered are listed, but only abnormal results are displayed) Labs Reviewed  CBC - Abnormal; Notable for the following components:      Result Value   WBC 12.5 (*)    All other components within normal limits  DIFFERENTIAL - Abnormal; Notable for the following components:   Neutro Abs 9.0 (*)    Monocytes Absolute 1.1 (*)    All other components within normal limits  COMPREHENSIVE METABOLIC PANEL - Abnormal; Notable for the following components:   Sodium 130 (*)    Chloride 89 (*)    Glucose, Bld 138 (*)    Creatinine, Ser 1.40 (*)    AST 54 (*)    GFR calc non Af Amer 39 (*)    GFR calc Af Amer 45 (*)    All other components within normal limits  URINALYSIS, ROUTINE W REFLEX MICROSCOPIC - Abnormal; Notable for the following components:   APPearance HAZY (*)    All other components within normal limits  PROTIME-INR  APTT    EKG None  Radiology Ct Head Wo Contrast  Result Date: 07/26/2018 CLINICAL DATA:  [age] year old female with dizziness and altered mental status after medication change. EXAM: CT HEAD WITHOUT CONTRAST TECHNIQUE: Contiguous axial images were obtained from the base of the skull through the vertex without intravenous contrast. COMPARISON:  None. FINDINGS: Brain: Cerebral volume is within  normal limits for age. No midline shift, ventriculomegaly, mass effect, evidence of mass lesion, intracranial hemorrhage or evidence of cortically based acute infarction. Gray-white matter differentiation is within normal limits throughout the brain. Vascular: No suspicious intracranial vascular hyperdensity. Skull: No acute osseous abnormality identified. Sinuses/Orbits: Tympanic cavities are clear. Visualized paranasal sinuses and mastoids are well pneumatized. Other: Visualized orbits and scalp soft tissues are within normal limits. IMPRESSION: Normal for age noncontrast CT appearance of the brain. Electronically Signed   By: Odessa FlemingH  Hall M.D.   On: 07/26/2018 21:35    Procedures Procedures (including critical care time)  Medications Ordered in ED Medications  sodium chloride 0.9 % bolus 1,000 mL (0 mLs Intravenous Stopped 07/27/18 0430)     Initial Impression / Assessment and Plan / ED Course  I have reviewed the triage vital signs and the nursing notes.  Pertinent labs & imaging results that were available during my care of the patient were reviewed by me and considered in my medical decision making (see chart for details).        Patient presents with dizziness.  Feels this is related to her blood pressure.  Reports difficulties with blood pressure when transitioning back to Suboxone.  She is also continuing to take her blood pressure medications.  She reports dizziness mostly upon standing.  She is overall nontoxic and initial blood pressure here is 115 systolic.  She is not orthostatic.  However, she does have some evidence of dehydration with some mild AKI.  She was given fluids.  Lab work reviewed from triage.  She is nonfocal.  No signs or symptoms of stroke.  CT head is negative.  On recheck, patient reports she feels much better.  At this time I do not feel she needs further management.  I have recommended that she discontinue taking her blood pressure medications until her transition to  Suboxone can be sorted out.  Patient stated understanding.  After history, exam, and medical workup I feel the patient has been appropriately medically screened and is safe for discharge home. Pertinent diagnoses were discussed  with the patient. Patient was given return precautions.   Final Clinical Impressions(s) / ED Diagnoses   Final diagnoses:  Dizziness  Adverse effect of drug, initial encounter    ED Discharge Orders    None       Shon Baton, MD 07/27/18 [phone]

## 2018-07-27 NOTE — Discharge Instructions (Addendum)
You were seen today for dizziness.  This may be related to episodes of low blood pressure.  Discontinue taking your blood pressure medications until you can be reassessed by your primary physician.  You should avoid additional blood pressure lowering medications.

## 2018-07-27 NOTE — ED Notes (Addendum)
Pt's son wants an update on his mom asap. Eric: [phone]

## 2018-07-27 NOTE — ED Notes (Signed)
[phone] Eric(Son) can report about pt.

## 2018-07-29 ENCOUNTER — Other Ambulatory Visit: Payer: Self-pay

## 2018-07-29 ENCOUNTER — Encounter (HOSPITAL_BASED_OUTPATIENT_CLINIC_OR_DEPARTMENT_OTHER): Payer: Self-pay

## 2018-07-29 ENCOUNTER — Emergency Department (HOSPITAL_BASED_OUTPATIENT_CLINIC_OR_DEPARTMENT_OTHER)
Admission: EM | Admit: 2018-07-29 | Discharge: 2018-07-29 | Disposition: A | Discharge status: Home / Self Care | Payer: Medicare Other | Attending: Emergency Medicine | Admitting: Emergency Medicine

## 2018-07-29 DIAGNOSIS — R600 Localized edema: Secondary | ICD-10-CM

## 2018-07-29 DIAGNOSIS — Z79899 Other long term (current) drug therapy: Secondary | ICD-10-CM | POA: Diagnosis not present

## 2018-07-29 DIAGNOSIS — R609 Edema, unspecified: Secondary | ICD-10-CM | POA: Insufficient documentation

## 2018-07-29 DIAGNOSIS — R7303 Prediabetes: Secondary | ICD-10-CM | POA: Insufficient documentation

## 2018-07-29 DIAGNOSIS — R220 Localized swelling, mass and lump, head: Secondary | ICD-10-CM | POA: Diagnosis present

## 2018-07-29 DIAGNOSIS — I10 Essential (primary) hypertension: Secondary | ICD-10-CM | POA: Diagnosis not present

## 2018-07-29 HISTORY — DX: Other specified mononeuropathies: G58.8

## 2018-07-29 NOTE — ED Provider Notes (Signed)
[facility] EMERGENCY DEPARTMENT Provider Note   CSN: [id number] Arrival date & time: 07/29/18  1322    History   Chief Complaint Chief Complaint  Patient presents with  . Facial Swelling    HPI Katie Combs is a 68 y.o. female.     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 Pt was seen in the ED on 5/19 at which time she was having some confusion and low blood pressures. She was instructed to stop BP medications. Her mentation has cleared, but this morning she noticed some edema below her eyes and on her cheeks as well as on ankles/feet. She wonders if she is retaining fluid since she stopped her BP medications. She denies any CP, SOB, rash, new foods, fever, cough, or other complaints.  The history is provided by the patient.    Past Medical History:  Diagnosis Date  . Anxiety   . Arthritis   . DDD (degenerative disc disease), lumbar   . Depression   . Endometriosis   . GERD (gastroesophageal reflux disease)   . Hypertension   . Neuromuscular disorder (HCC)    pendulum neuralgia  . PONV (postoperative nausea and vomiting)   . Pre-diabetes   . Pudendal neuralgia   . Seasonal allergies     Patient Active Problem List   Diagnosis Date Noted  . Radiculopathy 05/12/2018    Past Surgical History:  Procedure Laterality Date  . ABDOMINAL HYSTERECTOMY    . BACK SURGERY    . BLADDER SURGERY     to help with UTI  . CHOLECYSTECTOMY    . COLONOSCOPY    . NERVE SURGERY  2009   for her neuralgia - out in [place]  . TOOTH EXTRACTION    . TRANSFORAMINAL LUMBAR INTERBODY FUSION (TLIF) WITH PEDICLE SCREW FIXATION 1 LEVEL Left 05/12/2018   Procedure: LEFT-SIDED LUMBAR 4-5 TRANSFORAMINAL LUMBAR INTERBODY FUSION WITH INSTRUMENTATION AND ALLOGRAFT;  Surgeon: Estill Bamberg, MD;  Location: [facility] OR;  Service: Orthopedics;  Laterality: Left;     OB History   No obstetric history on file.       Home Medications    Prior to Admission medications   Medication Sig Start Date End Date Taking? Authorizing Provider  aspirin EC 81 MG tablet Take 81 mg by mouth daily.    [provider]  cetirizine (ZYRTEC) 10 MG tablet Take 10 mg by mouth at bedtime.    [provider]  clonazePAM (KLONOPIN) 1 MG tablet Take 0.5 mg by mouth at bedtime.    [provider]  Coenzyme Q10 (CO Q-10) 200 MG CAPS Take 200 mg by mouth at bedtime.    [provider]  diazepam (VALIUM) 5 MG tablet Take 1 tablet (5 mg total) by mouth every 6 (six) hours as needed for muscle spasms. Patient not taking: Reported on 07/27/2018 05/12/18   Estill Bamberg, MD  DULoxetine (CYMBALTA) 30 MG capsule Take 30 mg by mouth daily.    [provider]  estradiol (CLIMARA - DOSED IN MG/24 HR) 0.0375 mg/24hr patch Place 0.0375 mg onto the skin 2 (two) times a week.    [provider]  gabapentin (NEURONTIN) 800 MG tablet Take 800-1,600 mg by mouth See admin instructions. Take 1600 mg by mouth in the morning and 800 mg at night    [provider]  lansoprazole (PREVACID) 30 MG capsule Take 30 mg by mouth 2 (two) times daily as needed (  heartburn).    [provider]  lisinopril (PRINIVIL,ZESTRIL) 20 MG tablet Take 20 mg by mouth daily.    [provider]  loratadine (CLARITIN) 10 MG tablet Take 10 mg by mouth daily.    [provider]  Multiple Vitamin (MULTIVITAMIN WITH MINERALS) TABS tablet Take 1 tablet by mouth daily.    [provider]  nebivolol (BYSTOLIC) 10 MG tablet Take 5 mg by mouth at bedtime.    [provider]  oxyCODONE-acetaminophen (PERCOCET/ROXICET) 5-325 MG tablet Take 1-2 tablets by mouth every 4 (four) hours as needed for moderate pain or severe pain. Patient not taking: Reported on 07/27/2018 05/12/18   Estill Bamberg, MD  rosuvastatin (CRESTOR) 5 MG tablet Take 2.5 mg by mouth at bedtime.    [provider]   SUBOXONE 8-2 MG FILM Place 0.25 Film under the tongue 2 (two) times a day.  05/06/18   [provider]  triamterene-hydrochlorothiazide (DYAZIDE) 37.5-25 MG capsule Take 1 capsule by mouth daily.    [provider]    Family History No family history on file.  Social History Social History   Tobacco Use  . Smoking status: Never Smoker  . Smokeless tobacco: Never Used  Substance Use Topics  . Alcohol use: Not Currently  . Drug use: Never     Allergies   Morphine and related; Macrodantin [nitrofurantoin macrocrystal]; Other; Tetanus toxoids; and Adhesive [tape]   Review of Systems Review of Systems All other systems reviewed and are negative except that which was mentioned in HPI   Physical Exam Updated Vital Signs BP (!) 156/88 (BP Location: Right Arm)   Pulse 97   Temp 98.4 F (36.9 C) (Oral)   Resp 16   Ht 5' 4.75" (1.645 m)   Wt 88.9 kg   SpO2 99%   BMI 32.87 kg/m   Physical Exam Vitals signs and nursing note reviewed.  Constitutional:      General: She is not in acute distress.    Appearance: She is well-developed.  HENT:     Head: Normocephalic and atraumatic.     Comments: Borderline puffiness below b/l eyes but no appreciable swelling of eyelids or other areas of edema    Mouth/Throat:     Mouth: Mucous membranes are moist.  Eyes:     Conjunctiva/sclera: Conjunctivae normal.  Neck:     Musculoskeletal: Neck supple.  Cardiovascular:     Rate and Rhythm: Normal rate and regular rhythm.     Heart sounds: Normal heart sounds. No murmur.  Pulmonary:     Effort: Pulmonary effort is normal.     Breath sounds: Normal breath sounds.  Abdominal:     General: Bowel sounds are normal. There is no distension.     Palpations: Abdomen is soft.     Tenderness: There is no abdominal tenderness.  Musculoskeletal:     Right lower leg: Edema present.     Left lower leg: Edema present.     Comments: Mild edema b/l feet and ankles  Skin:     General: Skin is warm and dry.  Neurological:     Mental Status: She is alert and oriented to person, place, and time.     Comments: Fluent speech  Psychiatric:        Thought Content: Thought content normal.        Judgment: Judgment normal.      ED Treatments / Results  Labs (all labs ordered are listed, but only abnormal results are  displayed) Labs Reviewed - No data to display  EKG None  Radiology No results found.  Procedures Procedures (including critical care time)  Medications Ordered in ED Medications - No data to display   Initial Impression / Assessment and Plan / ED Course  I have reviewed the triage vital signs and the nursing notes.        Comfortable on exam, mentating normally. BP 160/90. Her edema does not appear to be angioedema and she has been off lisinopril. No sx suggestive of allergic reaction. Given concurrent ankle swelling, I suspect mild fluid retention from being off meds. She has no SOB or other complaints to suggest significant volume overload. Recommended restarting lisinopril and dyazide, holding bystolic for now. She is scheduled to f/u with PCP next week. Have discussed low salt diet and supportive measures.  Extensively reviewed return precautions.  Final Clinical Impressions(s) / ED Diagnoses   Final diagnoses:  Mild peripheral edema  Essential hypertension    ED Discharge Orders    None       Little, Ambrose Finlandachel Morgan, MD 07/29/18 1420

## 2018-07-29 NOTE — ED Notes (Signed)
Pt reports facial swelling beginning this morning.  Recent back surgery 3/5 with recent medication changes due to low b/p.  Seen at [facility] Wednesday

## 2018-07-29 NOTE — Discharge Instructions (Signed)
I think that your swelling is from being off of some of your blood pressure medications. Please restart LISINOPRIL and DYAZIDE today and continue until follow up with primary care provider. You can hold off on restarting Bystolic for now.  Avoid salt in your diet to help with swelling.  Return to ER if you have any breathing problems, rash, chest pain, or sudden worsening of symptoms.

## 2018-07-29 NOTE — ED Triage Notes (Signed)
Pt states she woke this am with swelling around her eyes -states "they said I was talking out of my head" on Wednesday-denies today-(they is husband and sister per pt)-NAD-steady gait

## 2018-12-08 LAB — HM DIABETES EYE EXAM

## 2019-06-21 ENCOUNTER — Other Ambulatory Visit (HOSPITAL_BASED_OUTPATIENT_CLINIC_OR_DEPARTMENT_OTHER): Payer: Self-pay | Admitting: Internal Medicine

## 2019-06-21 ENCOUNTER — Other Ambulatory Visit (HOSPITAL_BASED_OUTPATIENT_CLINIC_OR_DEPARTMENT_OTHER): Payer: Self-pay | Admitting: Family Medicine

## 2019-06-21 ENCOUNTER — Other Ambulatory Visit (HOSPITAL_BASED_OUTPATIENT_CLINIC_OR_DEPARTMENT_OTHER): Payer: Self-pay | Admitting: Family

## 2019-06-21 DIAGNOSIS — Z1231 Encounter for screening mammogram for malignant neoplasm of breast: Secondary | ICD-10-CM

## 2019-07-24 ENCOUNTER — Other Ambulatory Visit: Payer: Self-pay

## 2019-07-24 ENCOUNTER — Encounter: Payer: Self-pay | Admitting: Family

## 2019-07-24 ENCOUNTER — Ambulatory Visit (HOSPITAL_BASED_OUTPATIENT_CLINIC_OR_DEPARTMENT_OTHER): Payer: Medicare Other

## 2019-07-24 ENCOUNTER — Ambulatory Visit (INDEPENDENT_AMBULATORY_CARE_PROVIDER_SITE_OTHER): Payer: Medicare PPO | Admitting: Family

## 2019-07-24 VITALS — BP 134/65 | HR 76 | Temp 98.5°F | Resp 16 | Ht 64.5 in | Wt 201.0 lb

## 2019-07-24 DIAGNOSIS — E119 Type 2 diabetes mellitus without complications: Secondary | ICD-10-CM | POA: Diagnosis not present

## 2019-07-24 DIAGNOSIS — F329 Major depressive disorder, single episode, unspecified: Secondary | ICD-10-CM

## 2019-07-24 DIAGNOSIS — J302 Other seasonal allergic rhinitis: Secondary | ICD-10-CM | POA: Insufficient documentation

## 2019-07-24 DIAGNOSIS — F32A Depression, unspecified: Secondary | ICD-10-CM | POA: Insufficient documentation

## 2019-07-24 DIAGNOSIS — E785 Hyperlipidemia, unspecified: Secondary | ICD-10-CM

## 2019-07-24 DIAGNOSIS — K219 Gastro-esophageal reflux disease without esophagitis: Secondary | ICD-10-CM | POA: Diagnosis not present

## 2019-07-24 DIAGNOSIS — I10 Essential (primary) hypertension: Secondary | ICD-10-CM | POA: Diagnosis not present

## 2019-07-24 DIAGNOSIS — M1990 Unspecified osteoarthritis, unspecified site: Secondary | ICD-10-CM | POA: Insufficient documentation

## 2019-07-24 DIAGNOSIS — M5136 Other intervertebral disc degeneration, lumbar region: Secondary | ICD-10-CM

## 2019-07-24 DIAGNOSIS — M51369 Other intervertebral disc degeneration, lumbar region without mention of lumbar back pain or lower extremity pain: Secondary | ICD-10-CM | POA: Insufficient documentation

## 2019-07-24 DIAGNOSIS — R7303 Prediabetes: Secondary | ICD-10-CM | POA: Insufficient documentation

## 2019-07-24 DIAGNOSIS — F419 Anxiety disorder, unspecified: Secondary | ICD-10-CM

## 2019-07-24 NOTE — Progress Notes (Signed)
Subjective:    Patient ID: Katie Combs, female    DOB: 1950/12/14, 69 y.o.   MRN: [id number]  HPI  Patient is a 69 yr old female who presents today to establish care.  Moved back in 2014. Has a son in [place].  Her GYN in [place] retired.    Pudendal neuralgia- worse with sitting.  Reports that she had surgery in 2009- "it was not a successful surgery." She is treated with suboxone for this pain by a provider in [place].Reports that it helps her pain tremendously. She denies a hx of opiate dependence. She is seen every 3 months now for suboxone treatment/surveillance.    She is also maintained on gabapentin.    Endometriosis- had hysterectomy at age 47.    Depression/anxiety- maintained on cymbalta and prn clonazepam.  Reports symptoms are well controlled.   HTN- maintained on lisinopril and bystolic, dyazide.  BP Readings from Last 3 Encounters:  07/24/19 134/65  07/29/18 (!) 156/88  07/27/18 (!) 101/55   Diabetes type 2- reports most recent A1C was 6.2. Lab Results  Component Value Date   HGBA1C 6.7 (H) 04/18/2018   Lab Results  Component Value Date   CREATININE 1.40 (H) 07/26/2018   Hyperlipidemia- maintained on crestor.  Depression/anxiety- reports that this is stale on cymbalta.   Insomnia- uses 1/2 tab klonopin HS prn.   GERD- stable on prevacid.   She stopped HRT at age 89.  She was given an rx for estradiol pills but she has not been taking.    Review of Systems See HPI  Past Medical History:  Diagnosis Date  . Anxiety   . Arthritis   . DDD (degenerative disc disease), lumbar   . Depression   . Endometriosis   . GERD (gastroesophageal reflux disease)   . Hypertension   . Neuromuscular disorder (HCC)    pendulum neuralgia  . PONV (postoperative nausea and vomiting)   . Pre-diabetes   . Pudendal neuralgia   . Seasonal allergies      Social History   Socioeconomic History  . Marital status: Married    Spouse name: Not on file  . Number of children:  1  . Years of education: Not on file  . Highest education level: Not on file  Occupational History  . Occupation: Retired  Tobacco Use  . Smoking status: Never Smoker  . Smokeless tobacco: Never Used  Substance and Sexual Activity  . Alcohol use: Not Currently  . Drug use: Never  . Sexual activity: Yes    Partners: Male  Other Topics Concern  . 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 Difficulty of Paying Living Expenses:   Food Insecurity:   . Worried About Programme researcher, broadcasting/film/video in the Last Year:   . Barista in the Last Year:   Transportation Needs:   . Freight forwarder (Medical):   Marland Kitchen Lack of Transportation (Non-Medical):   Physical Activity:   . Days of Exercise per Week:   . Minutes of Exercise per Session:   Stress:   . Feeling of Stress :   Social Connections:   . Frequency of Communication with Friends and Family:   . Frequency of Social Gatherings with Friends and Family:   . Attends  Religious Services:   . Active Member of Clubs or Organizations:   . Attends Archivist Meetings:   Marland Kitchen Marital Status:   Intimate Partner Violence:   . Fear of Current or Ex-Partner:   . Emotionally Abused:   Marland Kitchen Physically Abused:   . Sexually Abused:     Past Surgical History:  Procedure Laterality Date  . ABDOMINAL HYSTERECTOMY    . BACK SURGERY    . BLADDER SURGERY     to help with UTI  . CHOLECYSTECTOMY    . COLONOSCOPY    . NERVE SURGERY  2009   for her neuralgia - out in [place]  . TOOTH EXTRACTION    . TRANSFORAMINAL LUMBAR INTERBODY FUSION (TLIF) WITH PEDICLE SCREW FIXATION 1 LEVEL Left 05/12/2018   Procedure: LEFT-SIDED LUMBAR 4-5 TRANSFORAMINAL LUMBAR INTERBODY FUSION WITH INSTRUMENTATION AND ALLOGRAFT;  Surgeon: Phylliss Bob, MD;  Location: [facility];  Service:  Orthopedics;  Laterality: Left;    Family History  Problem Relation Age of Onset  . Hypertension Mother   . Hypertension Father   . Hypertension Sister   . Hypertension Sister     Allergies  Allergen Reactions  . Morphine And Related Anaphylaxis  . 5-Alpha Reductase Inhibitors   . Macrodantin [Nitrofurantoin Macrocrystal]     Flu like symptoms  . Other Other (See Comments)    Synthetic Sutures do not heal and had to be removed   . Tetanus Toxoids   . Adhesive [Tape] Rash    Current Outpatient Medications on File Prior to Visit  Medication Sig Dispense Refill  . aspirin EC 81 MG tablet Take 81 mg by mouth daily.    . cetirizine (ZYRTEC) 10 MG tablet Take 10 mg by mouth at bedtime.    . clonazePAM (KLONOPIN) 1 MG tablet Take 0.5 mg by mouth at bedtime.    . Coenzyme Q10 (CO Q-10) 200 MG CAPS Take 200 mg by mouth at bedtime.    . diazepam (VALIUM) 5 MG tablet Take 1 tablet (5 mg total) by mouth every 6 (six) hours as needed for muscle spasms. 30 tablet 0  . DULoxetine (CYMBALTA) 30 MG capsule Take 30 mg by mouth daily.    Marland Kitchen gabapentin (NEURONTIN) 800 MG tablet Take 800-1,600 mg by mouth See admin instructions. Take 1600 mg by mouth in the morning and 800 mg at night    . lansoprazole (PREVACID) 30 MG capsule Take 30 mg by mouth 2 (two) times daily as needed (heartburn).    Marland Kitchen lisinopril (PRINIVIL,ZESTRIL) 20 MG tablet Take 20 mg by mouth daily.    Marland Kitchen loratadine (CLARITIN) 10 MG tablet Take 10 mg by mouth daily.    . Multiple Vitamin (MULTIVITAMIN WITH MINERALS) TABS tablet Take 1 tablet by mouth daily.    . nebivolol (BYSTOLIC) 10 MG tablet Take 5 mg by mouth at bedtime.    . rosuvastatin (CRESTOR) 5 MG tablet Take 2.5 mg by mouth at bedtime.    . SUBOXONE 8-2 MG FILM Place 0.25 Film under the tongue 2 (two) times a day.     . triamterene-hydrochlorothiazide (DYAZIDE) 37.5-25 MG capsule Take 1 capsule by mouth daily.     No current facility-administered medications on file prior  to visit.    BP 134/65 (BP Location: Right Arm, Patient Position: Sitting, Cuff Size: Large)   Pulse 76   Temp 98.5 F (36.9 C) (Temporal)   Resp 16   Ht 5' 4.5" (1.638 m)   Wt 201 lb (91.2 kg)  SpO2 99%   BMI 33.97 kg/m       Objective:   Physical Exam Constitutional:      Appearance: She is well-developed.  Neck:     Thyroid: No thyromegaly.  Cardiovascular:     Rate and Rhythm: Normal rate and regular rhythm.     Heart sounds: Normal heart sounds. No murmur.  Pulmonary:     Effort: Pulmonary effort is normal. No respiratory distress.     Breath sounds: Normal breath sounds. No wheezing.  Musculoskeletal:     Cervical back: Neck supple.  Skin:    General: Skin is warm and dry.  Neurological:     Mental Status: She is alert and oriented to person, place, and time.  Psychiatric:        Behavior: Behavior normal.        Thought Content: Thought content normal.        Judgment: Judgment normal.           Assessment & Plan:  HTN- bp stable on current meds.   Pudendal neuralgia- stable. I advised pt that I would like her to continue with her current suboxone provider for suboxone rx and benzo rx. She is agreeable.  Insomnia- reports stable with prn klonopin.  GERD- stable on PPI.  DM2- clinically stable. Obtain follow up A1C and urine microalbumin.  Depression/anxiety- stable on cymbalta.   Hyperlipidemia- tolerating statin, obtain lipid panel.  50 minutes spent on today's visit reviewing records, interviewing patient and reviewing plan with patient.   This visit occurred during the SARS-CoV-2 public health emergency.  Safety protocols were in place, including screening questions prior to the visit, additional usage of staff PPE, and extensive cleaning of exam room while observing appropriate contact time as indicated for disinfecting solutions.

## 2019-07-24 NOTE — Patient Instructions (Signed)
Please complete lab work prior to leaving.  Welcome to ! 

## 2019-07-31 ENCOUNTER — Other Ambulatory Visit: Payer: Self-pay

## 2019-07-31 ENCOUNTER — Encounter (HOSPITAL_BASED_OUTPATIENT_CLINIC_OR_DEPARTMENT_OTHER): Payer: Self-pay

## 2019-07-31 ENCOUNTER — Ambulatory Visit (HOSPITAL_BASED_OUTPATIENT_CLINIC_OR_DEPARTMENT_OTHER)
Admission: RE | Admit: 2019-07-31 | Discharge: 2019-07-31 | Disposition: A | Discharge status: Home / Self Care | Payer: Medicare PPO | Source: Ambulatory Visit | Attending: Family | Admitting: Family

## 2019-07-31 DIAGNOSIS — Z1231 Encounter for screening mammogram for malignant neoplasm of breast: Secondary | ICD-10-CM | POA: Diagnosis present

## 2019-08-01 ENCOUNTER — Telehealth: Payer: Self-pay | Admitting: Family

## 2019-08-01 NOTE — Telephone Encounter (Signed)
It looks like she forgot to go to the lab after her last appointment.  Please contact pt to schedule a lab visit.

## 2019-08-01 NOTE — Telephone Encounter (Signed)
Called patient left message

## 2019-08-04 NOTE — Addendum Note (Signed)
Addended by: Mervin Kung A on: 08/04/2019 04:22 PM   Modules accepted: Orders

## 2019-08-08 ENCOUNTER — Other Ambulatory Visit: Payer: Medicare PPO

## 2019-08-09 ENCOUNTER — Other Ambulatory Visit (INDEPENDENT_AMBULATORY_CARE_PROVIDER_SITE_OTHER): Payer: Medicare PPO

## 2019-08-09 ENCOUNTER — Other Ambulatory Visit: Payer: Self-pay

## 2019-08-09 ENCOUNTER — Encounter: Payer: Self-pay | Admitting: Family

## 2019-08-09 DIAGNOSIS — E119 Type 2 diabetes mellitus without complications: Secondary | ICD-10-CM | POA: Diagnosis not present

## 2019-08-09 DIAGNOSIS — E785 Hyperlipidemia, unspecified: Secondary | ICD-10-CM | POA: Diagnosis not present

## 2019-08-09 LAB — COMPREHENSIVE METABOLIC PANEL
ALT: 16 U/L (ref 0–35)
AST: 26 U/L (ref 0–37)
Albumin: 4.3 g/dL (ref 3.5–5.2)
Alkaline Phosphatase: 94 U/L (ref 39–117)
BUN: 28 mg/dL — ABNORMAL HIGH (ref 6–23)
CO2: 34 mEq/L — ABNORMAL HIGH (ref 19–32)
Calcium: 10.6 mg/dL — ABNORMAL HIGH (ref 8.4–10.5)
Chloride: 94 mEq/L — ABNORMAL LOW (ref 96–112)
Creatinine, Ser: 1.3 mg/dL — ABNORMAL HIGH (ref 0.40–1.20)
GFR: 40.77 mL/min — ABNORMAL LOW (ref >60.00)
Glucose, Bld: 99 mg/dL (ref 70–99)
Potassium: 4.7 mEq/L (ref 3.5–5.1)
Sodium: 134 mEq/L — ABNORMAL LOW (ref 135–145)
Total Bilirubin: 0.3 mg/dL (ref 0.2–1.2)
Total Protein: 6.8 g/dL (ref 6.0–8.3)

## 2019-08-09 LAB — LIPID PANEL
Cholesterol: 203 mg/dL — ABNORMAL HIGH (ref 0–200)
HDL: 46.3 mg/dL (ref >39.00)
NonHDL: 157.14
Total CHOL/HDL Ratio: 4
Triglycerides: 203 mg/dL — ABNORMAL HIGH (ref 0.0–149.0)
VLDL: 40.6 mg/dL — ABNORMAL HIGH (ref 0.0–40.0)

## 2019-08-09 LAB — MICROALBUMIN / CREATININE URINE RATIO
Creatinine,U: 158.2 mg/dL
Microalb Creat Ratio: 0.6 mg/g (ref 0.0–30.0)
Microalb, Ur: 1 mg/dL (ref 0.0–1.9)

## 2019-08-09 LAB — HEMOGLOBIN A1C: Hgb A1c MFr Bld: 6.9 % — ABNORMAL HIGH (ref 4.6–6.5)

## 2019-08-09 LAB — LDL CHOLESTEROL, DIRECT: Direct LDL: 126 mg/dL

## 2019-08-22 ENCOUNTER — Encounter: Payer: Medicare PPO | Admitting: Family

## 2019-09-05 ENCOUNTER — Other Ambulatory Visit: Payer: Self-pay

## 2019-09-05 ENCOUNTER — Ambulatory Visit (INDEPENDENT_AMBULATORY_CARE_PROVIDER_SITE_OTHER): Payer: Medicare PPO | Admitting: Family

## 2019-09-05 ENCOUNTER — Telehealth: Payer: Self-pay | Admitting: Family

## 2019-09-05 ENCOUNTER — Encounter: Payer: Self-pay | Admitting: Family

## 2019-09-05 VITALS — BP 111/69 | HR 88 | Temp 97.6°F | Resp 16 | Ht 64.5 in | Wt 199.0 lb

## 2019-09-05 DIAGNOSIS — Z0000 Encounter for general adult medical examination without abnormal findings: Secondary | ICD-10-CM

## 2019-09-05 NOTE — Patient Instructions (Addendum)
Please complete lab work prior to leaving.   

## 2019-09-05 NOTE — Progress Notes (Signed)
Subjective:    Patient ID: Katie Combs, female    DOB: 10/28/1950, 69 y.o.   MRN: [id number]  HPI  Patient presents today for complete physical.  Immunizations:  She thinks that she had her pneumovax from her old PCP.  Diet:  Does not cook as much as she used to,  Wt Readings from Last 3 Encounters:  09/05/19 199 lb (90.3 kg)  07/24/19 201 lb (91.2 kg)  07/29/18 196 lb (88.9 kg)  Exercise: some walking, limited since her vaccine Colonoscopy: reports that her last + cologuard 2 years ago (reports positive) then had a colonoscopy which was reportedly negative.  This was 2 years ago 2019  (Dr. Elnoria Howard) Dexa: reports 2 years ago and it "was OK" Pap Smear: n/a Mammogram: 07/31/19  She had back surgery with Dr. Marissa Nestle.    Review of Systems  Constitutional: Negative for unexpected weight change.  HENT: Positive for rhinorrhea. Negative for hearing loss.   Eyes: Negative for visual disturbance.  Respiratory: Negative for cough and shortness of breath.   Cardiovascular: Negative for palpitations.  Gastrointestinal: Positive for constipation (secondary to suboxone, uses benefiber every morning). Negative for diarrhea.  Genitourinary: Negative for dysuria, frequency and hematuria.  Musculoskeletal: Positive for back pain. Negative for arthralgias and myalgias.       Reports some pain in her right thigh following her back surgery- she will follow up with Dr. Yevette Edwards.   Skin: Positive for rash (dermatitis, scalp. Sees dermatology).  Neurological: Negative for headaches.  Hematological: Negative for adenopathy.  Psychiatric/Behavioral: Negative.        Denies depression, some anxiety but manages    Past Medical History:  Diagnosis Date   Anxiety    Arthritis    DDD (degenerative disc disease), lumbar    Depression    Endometriosis    GERD (gastroesophageal reflux disease)    Hypertension    Neuromuscular disorder (HCC)    pendulum neuralgia   PONV (postoperative nausea  and vomiting)    Pre-diabetes    Pudendal neuralgia    Seasonal allergies      Social History   Socioeconomic History   Marital status: Married    Spouse name: Not on file   Number of children: 1   Years of education: Not on file   Highest education level: Not on file  Occupational History   Occupation: Retired  Tobacco Use   Smoking status: Never Smoker   Smokeless tobacco: Never Used  Building services engineer Use: Never used  Substance and Sexual Activity   Alcohol use: Not Currently   Drug use: Never   Sexual activity: Yes    Partners: Male  Other Topics Concern   Not on file  Social History Narrative   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)   Married    1 son Armed forces technical officer) lives in [place]   2 granddaughters   Social Determinants of Health   Financial Resource Strain:    Difficulty of Paying Living Expenses:   Food Insecurity:    Worried About Programme researcher, broadcasting/film/video in the Last Year:    Barista in the Last Year:   Transportation Needs:    Freight forwarder (Medical):    Lack of Transportation (Non-Medical):   Physical Activity:    Days of Exercise per Week:    Minutes of Exercise per Session:   Stress:    Feeling of Stress :   Social Connections:    Frequency of  Communication with Friends and Family:    Frequency of Social Gatherings with Friends and Family:    Attends Religious Services:    Active Member of Clubs or Organizations:    Attends Engineer, structural:    Marital Status:   Intimate Partner Violence:    Fear of Current or Ex-Partner:    Emotionally Abused:    Physically Abused:    Sexually Abused:     Past Surgical History:  Procedure Laterality Date   ABDOMINAL HYSTERECTOMY     BACK SURGERY     BLADDER SURGERY     to help with UTI   CHOLECYSTECTOMY     COLONOSCOPY     NERVE SURGERY  2009   for her neuralgia - out in [place]   TOOTH EXTRACTION     TRANSFORAMINAL  LUMBAR INTERBODY FUSION (TLIF) WITH PEDICLE SCREW FIXATION 1 LEVEL Left 05/12/2018   Procedure: LEFT-SIDED LUMBAR 4-5 TRANSFORAMINAL LUMBAR INTERBODY FUSION WITH INSTRUMENTATION AND ALLOGRAFT;  Surgeon: Estill Bamberg, MD;  Location: [facility] OR;  Service: Orthopedics;  Laterality: Left;    Family History  Problem Relation Age of Onset   Hypertension Mother    Hypertension Father    Hypertension Sister    Hypertension Sister     Allergies  Allergen Reactions   Morphine And Related Anaphylaxis   5-Alpha Reductase Inhibitors    Macrodantin [Nitrofurantoin Macrocrystal]     Flu like symptoms   Other Other (See Comments)    Synthetic Sutures do not heal and had to be removed    Tetanus Toxoids     Developed a bad sore on her arm and ran a fever for 1 week   Adhesive [Tape] Rash    Current Outpatient Medications on File Prior to Visit  Medication Sig Dispense Refill   aspirin EC 81 MG tablet Take 81 mg by mouth daily.     cetirizine (ZYRTEC) 10 MG tablet Take 10 mg by mouth at bedtime.     clonazePAM (KLONOPIN) 1 MG tablet Take 0.5 mg by mouth at bedtime.     Coenzyme Q10 (CO Q-10) 200 MG CAPS Take 200 mg by mouth at bedtime.     DULoxetine (CYMBALTA) 30 MG capsule Take 30 mg by mouth daily.     gabapentin (NEURONTIN) 800 MG tablet Take 800-1,600 mg by mouth See admin instructions. Take 1600 mg by mouth in the morning and 800 mg at night     lansoprazole (PREVACID) 30 MG capsule Take 30 mg by mouth 2 (two) times daily as needed (heartburn).     lisinopril (PRINIVIL,ZESTRIL) 20 MG tablet Take 20 mg by mouth daily.     loratadine (CLARITIN) 10 MG tablet Take 10 mg by mouth daily.     Multiple Vitamin (MULTIVITAMIN WITH MINERALS) TABS tablet Take 1 tablet by mouth daily.     nebivolol (BYSTOLIC) 10 MG tablet Take 5 mg by mouth at bedtime.     rosuvastatin (CRESTOR) 5 MG tablet Take 2.5 mg by mouth at bedtime.     SUBOXONE 8-2 MG FILM Place 0.25 Film under the tongue 2  (two) times a day.      tiZANidine (ZANAFLEX) 2 MG tablet Take 2 mg by mouth at bedtime.     triamterene-hydrochlorothiazide (DYAZIDE) 37.5-25 MG capsule Take 1 capsule by mouth daily.     No current facility-administered medications on file prior to visit.    BP 111/69 (BP Location: Right Arm, Patient Position: Sitting, Cuff Size: Large)    Pulse  88    Temp 97.6 F (36.4 C) (Temporal)    Resp 16    Ht 5' 4.5" (1.638 m)    Wt 199 lb (90.3 kg)    SpO2 98%    BMI 33.63 kg/m       Objective:   Physical Exam  Physical Exam  Constitutional: She is oriented to person, place, and time. She appears well-developed and well-nourished. No distress.  HENT:  Head: Normocephalic and atraumatic.  Right Ear: Tympanic membrane and ear canal normal.  Left Ear: Tympanic membrane and ear canal normal.  Mouth/Throat: not examined- pt wearing mask.  Eyes: Pupils are equal, round, and reactive to light. No scleral icterus.  Neck: Normal range of motion. No thyromegaly present.  Cardiovascular: Normal rate and regular rhythm.   No murmur heard. Pulmonary/Chest: Effort normal and breath sounds normal. No respiratory distress. He has no wheezes. She has no rales. She exhibits no tenderness.  Abdominal: Soft. Bowel sounds are normal. She exhibits no distension and no mass. There is no tenderness. There is no rebound and no guarding.  Musculoskeletal: She exhibits no edema.  Lymphadenopathy:    She has no cervical adenopathy.  Neurological: She is alert and oriented to person, place, and time. She has normal patellar reflexes. She exhibits normal muscle tone. Coordination normal.  Skin: Skin is warm and dry.  Psychiatric: She has a normal mood and affect. Her behavior is normal. Judgment and thought content normal.          Assessment & Plan:  Preventative care- discussed healthy diet, exercise and weight loss. She will obtain a copy of her immunization report from her previous PCP (who still manages  her suboxone) and bring to [place] for review.  Mammo/colo up to date. Will request copy of colo report. Advised pt to follow up in 3 months.   This visit occurred during the SARS-CoV-2 public health emergency.  Safety protocols were in place, including screening questions prior to the visit, additional usage of staff PPE, and extensive cleaning of exam room while observing appropriate contact time as indicated for disinfecting solutions.          Assessment & Plan:

## 2019-09-05 NOTE — Telephone Encounter (Signed)
Please call Dr. Haywood Pao office and request copy of GI report.

## 2019-09-12 NOTE — Telephone Encounter (Signed)
Medical records request faxed to [facility] medical.

## 2019-12-04 ENCOUNTER — Telehealth: Payer: Self-pay | Admitting: Family

## 2019-12-04 NOTE — Telephone Encounter (Signed)
Caller name: Char Call back number: [phone]  Patient states she need a letter to excuse her from jury duty. Her jury duty is scheduled for 12/13/19. Please call patient when ready.

## 2019-12-05 ENCOUNTER — Encounter: Payer: Self-pay | Admitting: Family

## 2019-12-05 NOTE — Telephone Encounter (Signed)
Please advise 

## 2019-12-05 NOTE — Telephone Encounter (Signed)
Letter posted to pt's mychart.  

## 2019-12-06 NOTE — Telephone Encounter (Signed)
Patient unable to print letter, letter printed out and left at front for patient's husband to pick up.

## 2019-12-12 ENCOUNTER — Ambulatory Visit: Payer: Medicare PPO | Admitting: Family

## 2020-01-01 ENCOUNTER — Encounter: Payer: Self-pay | Admitting: Family

## 2020-01-01 ENCOUNTER — Telehealth: Payer: Self-pay | Admitting: Family

## 2020-01-01 NOTE — Telephone Encounter (Signed)
See mychart.  

## 2020-10-07 ENCOUNTER — Other Ambulatory Visit (HOSPITAL_BASED_OUTPATIENT_CLINIC_OR_DEPARTMENT_OTHER): Payer: Self-pay | Admitting: Physician Assistant

## 2020-10-07 ENCOUNTER — Other Ambulatory Visit (HOSPITAL_BASED_OUTPATIENT_CLINIC_OR_DEPARTMENT_OTHER): Payer: Self-pay

## 2020-10-07 DIAGNOSIS — Z1231 Encounter for screening mammogram for malignant neoplasm of breast: Secondary | ICD-10-CM

## 2020-10-29 ENCOUNTER — Encounter (HOSPITAL_BASED_OUTPATIENT_CLINIC_OR_DEPARTMENT_OTHER): Payer: Self-pay

## 2020-10-29 ENCOUNTER — Other Ambulatory Visit: Payer: Self-pay

## 2020-10-29 ENCOUNTER — Ambulatory Visit (HOSPITAL_BASED_OUTPATIENT_CLINIC_OR_DEPARTMENT_OTHER)
Admission: RE | Admit: 2020-10-29 | Discharge: 2020-10-29 | Disposition: A | Discharge status: Home / Self Care | Payer: Medicare PPO | Source: Ambulatory Visit | Attending: Physician Assistant | Admitting: Physician Assistant

## 2020-10-29 DIAGNOSIS — Z1231 Encounter for screening mammogram for malignant neoplasm of breast: Secondary | ICD-10-CM | POA: Diagnosis not present

## 2021-07-14 ENCOUNTER — Other Ambulatory Visit: Payer: Self-pay | Admitting: Orthopedic Surgery

## 2021-07-14 DIAGNOSIS — M545 Low back pain, unspecified: Secondary | ICD-10-CM

## 2021-07-16 ENCOUNTER — Other Ambulatory Visit (HOSPITAL_BASED_OUTPATIENT_CLINIC_OR_DEPARTMENT_OTHER): Payer: Self-pay | Admitting: Cardiology

## 2021-07-16 ENCOUNTER — Other Ambulatory Visit: Payer: Self-pay | Admitting: Orthopedic Surgery

## 2021-07-16 DIAGNOSIS — M545 Low back pain, unspecified: Secondary | ICD-10-CM

## 2021-07-27 ENCOUNTER — Ambulatory Visit
Admission: RE | Admit: 2021-07-27 | Discharge: 2021-07-27 | Disposition: A | Discharge status: Home / Self Care | Payer: Medicare PPO | Source: Ambulatory Visit | Attending: Orthopedic Surgery | Admitting: Orthopedic Surgery

## 2021-07-27 DIAGNOSIS — M545 Low back pain, unspecified: Secondary | ICD-10-CM

## 2021-08-22 ENCOUNTER — Ambulatory Visit
Admission: RE | Admit: 2021-08-22 | Discharge: 2021-08-22 | Disposition: A | Discharge status: Home / Self Care | Payer: Medicare PPO | Source: Ambulatory Visit | Attending: Orthopedic Surgery | Admitting: Orthopedic Surgery

## 2021-08-22 DIAGNOSIS — G8929 Other chronic pain: Secondary | ICD-10-CM

## 2021-08-22 NOTE — Discharge Instructions (Signed)

## 2021-08-26 ENCOUNTER — Other Ambulatory Visit: Payer: Self-pay | Admitting: Orthopedic Surgery

## 2021-08-29 ENCOUNTER — Inpatient Hospital Stay (HOSPITAL_COMMUNITY)
Admission: EM | Admit: 2021-08-29 | Discharge: 2021-09-04 | DRG: 454 | Disposition: A | Discharge status: Home Health | Payer: Medicare PPO | Source: Ambulatory Visit | Attending: Orthopedic Surgery | Admitting: Orthopedic Surgery

## 2021-08-29 ENCOUNTER — Emergency Department (HOSPITAL_COMMUNITY): Payer: Medicare PPO

## 2021-08-29 ENCOUNTER — Other Ambulatory Visit: Payer: Self-pay

## 2021-08-29 ENCOUNTER — Encounter (HOSPITAL_COMMUNITY): Payer: Self-pay

## 2021-08-29 DIAGNOSIS — K219 Gastro-esophageal reflux disease without esophagitis: Secondary | ICD-10-CM | POA: Diagnosis present

## 2021-08-29 DIAGNOSIS — M5116 Intervertebral disc disorders with radiculopathy, lumbar region: Principal | ICD-10-CM | POA: Diagnosis present

## 2021-08-29 DIAGNOSIS — R7303 Prediabetes: Secondary | ICD-10-CM | POA: Diagnosis not present

## 2021-08-29 DIAGNOSIS — Z7982 Long term (current) use of aspirin: Secondary | ICD-10-CM

## 2021-08-29 DIAGNOSIS — M48061 Spinal stenosis, lumbar region without neurogenic claudication: Secondary | ICD-10-CM | POA: Diagnosis present

## 2021-08-29 DIAGNOSIS — E785 Hyperlipidemia, unspecified: Secondary | ICD-10-CM

## 2021-08-29 DIAGNOSIS — M5440 Lumbago with sciatica, unspecified side: Secondary | ICD-10-CM | POA: Diagnosis not present

## 2021-08-29 DIAGNOSIS — K5900 Constipation, unspecified: Secondary | ICD-10-CM | POA: Diagnosis present

## 2021-08-29 DIAGNOSIS — M549 Dorsalgia, unspecified: Secondary | ICD-10-CM | POA: Diagnosis not present

## 2021-08-29 DIAGNOSIS — R627 Adult failure to thrive: Secondary | ICD-10-CM | POA: Diagnosis present

## 2021-08-29 DIAGNOSIS — G8929 Other chronic pain: Secondary | ICD-10-CM | POA: Diagnosis present

## 2021-08-29 DIAGNOSIS — M5416 Radiculopathy, lumbar region: Secondary | ICD-10-CM | POA: Diagnosis not present

## 2021-08-29 DIAGNOSIS — Z90710 Acquired absence of both cervix and uterus: Secondary | ICD-10-CM | POA: Diagnosis not present

## 2021-08-29 DIAGNOSIS — Z79899 Other long term (current) drug therapy: Secondary | ICD-10-CM

## 2021-08-29 DIAGNOSIS — F419 Anxiety disorder, unspecified: Secondary | ICD-10-CM | POA: Diagnosis present

## 2021-08-29 DIAGNOSIS — N179 Acute kidney failure, unspecified: Secondary | ICD-10-CM

## 2021-08-29 DIAGNOSIS — D649 Anemia, unspecified: Secondary | ICD-10-CM

## 2021-08-29 DIAGNOSIS — F32A Depression, unspecified: Secondary | ICD-10-CM | POA: Diagnosis present

## 2021-08-29 DIAGNOSIS — M4856XA Collapsed vertebra, not elsewhere classified, lumbar region, initial encounter for fracture: Secondary | ICD-10-CM | POA: Diagnosis not present

## 2021-08-29 DIAGNOSIS — E1122 Type 2 diabetes mellitus with diabetic chronic kidney disease: Secondary | ICD-10-CM | POA: Diagnosis present

## 2021-08-29 DIAGNOSIS — Z8249 Family history of ischemic heart disease and other diseases of the circulatory system: Secondary | ICD-10-CM

## 2021-08-29 DIAGNOSIS — I129 Hypertensive chronic kidney disease with stage 1 through stage 4 chronic kidney disease, or unspecified chronic kidney disease: Secondary | ICD-10-CM | POA: Diagnosis present

## 2021-08-29 DIAGNOSIS — Z6833 Body mass index (BMI) 33.0-33.9, adult: Secondary | ICD-10-CM

## 2021-08-29 DIAGNOSIS — E669 Obesity, unspecified: Secondary | ICD-10-CM

## 2021-08-29 DIAGNOSIS — I10 Essential (primary) hypertension: Secondary | ICD-10-CM | POA: Diagnosis not present

## 2021-08-29 DIAGNOSIS — N1831 Chronic kidney disease, stage 3a: Secondary | ICD-10-CM | POA: Diagnosis present

## 2021-08-29 DIAGNOSIS — E871 Hypo-osmolality and hyponatremia: Secondary | ICD-10-CM | POA: Diagnosis present

## 2021-08-29 DIAGNOSIS — E119 Type 2 diabetes mellitus without complications: Secondary | ICD-10-CM | POA: Diagnosis not present

## 2021-08-29 DIAGNOSIS — M5442 Lumbago with sciatica, left side: Secondary | ICD-10-CM | POA: Diagnosis not present

## 2021-08-29 DIAGNOSIS — M545 Low back pain, unspecified: Secondary | ICD-10-CM | POA: Diagnosis present

## 2021-08-29 HISTORY — DX: Prediabetes: R73.03

## 2021-08-29 LAB — CBC WITH DIFFERENTIAL/PLATELET
Abs Immature Granulocytes: 0.04 10*3/uL (ref 0.00–0.07)
Basophils Absolute: 0.1 10*3/uL (ref 0.0–0.1)
Basophils Relative: 1 %
Eosinophils Absolute: 0.1 10*3/uL (ref 0.0–0.5)
Eosinophils Relative: 1 %
HCT: 48.2 % — ABNORMAL HIGH (ref 36.0–46.0)
Hemoglobin: 16.3 g/dL — ABNORMAL HIGH (ref 12.0–15.0)
Immature Granulocytes: 0 %
Lymphocytes Relative: 14 %
Lymphs Abs: 1.4 10*3/uL (ref 0.7–4.0)
MCH: 28.1 pg (ref 26.0–34.0)
MCHC: 33.8 g/dL (ref 30.0–36.0)
MCV: 83.1 fL (ref 80.0–100.0)
Monocytes Absolute: 0.6 10*3/uL (ref 0.1–1.0)
Monocytes Relative: 6 %
Neutro Abs: 8.3 10*3/uL — ABNORMAL HIGH (ref 1.7–7.7)
Neutrophils Relative %: 78 %
Platelets: 371 10*3/uL (ref 150–400)
RBC: 5.8 MIL/uL — ABNORMAL HIGH (ref 3.87–5.11)
RDW: 13.8 % (ref 11.5–15.5)
WBC: 10.6 10*3/uL — ABNORMAL HIGH (ref 4.0–10.5)
nRBC: 0 % (ref 0.0–0.2)

## 2021-08-29 LAB — BASIC METABOLIC PANEL
Anion gap: 15 (ref 5–15)
BUN: 24 mg/dL — ABNORMAL HIGH (ref 8–23)
CO2: 22 mmol/L (ref 22–32)
Calcium: 10.5 mg/dL — ABNORMAL HIGH (ref 8.9–10.3)
Chloride: 93 mmol/L — ABNORMAL LOW (ref 98–111)
Creatinine, Ser: 1.24 mg/dL — ABNORMAL HIGH (ref 0.44–1.00)
GFR, Estimated: 47 mL/min — ABNORMAL LOW (ref >60)
Glucose, Bld: 131 mg/dL — ABNORMAL HIGH (ref 70–99)
Potassium: 4.1 mmol/L (ref 3.5–5.1)
Sodium: 130 mmol/L — ABNORMAL LOW (ref 135–145)

## 2021-08-29 MED ORDER — CLONAZEPAM 0.5 MG PO TABS
1.0000 mg | ORAL_TABLET | Freq: Every day | ORAL | Status: DC
  Administered 2021-08-29 – 2021-09-02 (×5): 1 mg via ORAL
  Filled 2021-08-29 (×6): qty 2

## 2021-08-29 MED ORDER — BISACODYL 5 MG PO TBEC
5.0000 mg | DELAYED_RELEASE_TABLET | Freq: Every day | ORAL | Status: DC | PRN
  Administered 2021-08-29: 5 mg via ORAL
  Filled 2021-08-29: qty 1

## 2021-08-29 MED ORDER — INSULIN ASPART 100 UNIT/ML IJ SOLN: 0.0000 [IU] | Freq: Three times a day (TID) | INTRAMUSCULAR | Status: DC

## 2021-08-29 MED ORDER — SENNOSIDES-DOCUSATE SODIUM 8.6-50 MG PO TABS: 1.0000 | ORAL_TABLET | Freq: Every evening | ORAL | Status: DC | PRN

## 2021-08-29 MED ORDER — NEBIVOLOL HCL 5 MG PO TABS
5.0000 mg | ORAL_TABLET | Freq: Every day | ORAL | Status: DC
  Administered 2021-08-29 – 2021-09-03 (×6): 5 mg via ORAL
  Filled 2021-08-29 (×7): qty 1

## 2021-08-29 MED ORDER — GABAPENTIN 800 MG PO TABS: 800.0000 mg | ORAL_TABLET | ORAL | Status: DC

## 2021-08-29 MED ORDER — DULOXETINE HCL 30 MG PO CPEP
30.0000 mg | ORAL_CAPSULE | Freq: Every day | ORAL | Status: DC
  Administered 2021-08-30 – 2021-09-04 (×6): 30 mg via ORAL
  Filled 2021-08-29 (×6): qty 1

## 2021-08-29 MED ORDER — ROSUVASTATIN CALCIUM 5 MG PO TABS
2.5000 mg | ORAL_TABLET | Freq: Every day | ORAL | Status: DC
  Administered 2021-08-29 – 2021-09-03 (×6): 2.5 mg via ORAL
  Filled 2021-08-29 (×6): qty 1

## 2021-08-29 MED ORDER — HYDROMORPHONE HCL 1 MG/ML IJ SOLN
1.0000 mg | Freq: Once | INTRAMUSCULAR | Status: AC
  Administered 2021-08-29: 1 mg via INTRAVENOUS
  Filled 2021-08-29: qty 1

## 2021-08-29 MED ORDER — METHOCARBAMOL 750 MG PO TABS
750.0000 mg | ORAL_TABLET | Freq: Four times a day (QID) | ORAL | Status: DC | PRN
Start: 2021-08-29 — End: 2021-09-04
  Administered 2021-08-29 – 2021-09-04 (×12): 750 mg via ORAL
  Filled 2021-08-29 (×12): qty 1

## 2021-08-29 MED ORDER — HYDROMORPHONE HCL 1 MG/ML IJ SOLN
0.5000 mg | INTRAMUSCULAR | Status: DC | PRN
  Administered 2021-08-29 – 2021-08-31 (×11): 1 mg via INTRAVENOUS
  Filled 2021-08-29 (×11): qty 1

## 2021-08-29 MED ORDER — ACETAMINOPHEN 325 MG PO TABS: 650.0000 mg | ORAL_TABLET | Freq: Four times a day (QID) | ORAL | Status: DC | PRN

## 2021-08-29 MED ORDER — ACETAMINOPHEN 650 MG RE SUPP: 650.0000 mg | Freq: Four times a day (QID) | RECTAL | Status: DC | PRN

## 2021-08-29 MED ORDER — ONDANSETRON HCL 4 MG/2ML IJ SOLN: 4.0000 mg | Freq: Four times a day (QID) | INTRAMUSCULAR | Status: DC | PRN

## 2021-08-29 MED ORDER — GABAPENTIN 400 MG PO CAPS
1600.0000 mg | ORAL_CAPSULE | Freq: Every morning | ORAL | Status: DC
Start: 2021-08-30 — End: 2021-09-02
  Administered 2021-08-30 – 2021-09-02 (×4): 1600 mg via ORAL
  Filled 2021-08-29 (×4): qty 4

## 2021-08-29 MED ORDER — GABAPENTIN 400 MG PO CAPS
800.0000 mg | ORAL_CAPSULE | Freq: Every day | ORAL | Status: DC
  Administered 2021-08-29 – 2021-09-03 (×6): 800 mg via ORAL
  Filled 2021-08-29 (×6): qty 2

## 2021-08-29 NOTE — ED Provider Triage Note (Signed)
Emergency Medicine Provider Triage Evaluation Note  Katie Combs , a 71 y.o. female  was evaluated in triage.  Pt complains of lower back pain that started this morning.  Patient does suffer from chronic back pain and has known herniated disc.  She called Dr. Yevette Edwards her orthopedist earlier today who instructed her to come to the emergency department.  She is having pain that radiates down the left lower extremity.  No weakness or numbness to the legs.  No bowel or bladder incontinence.  Review of Systems  Positive:  Negative: See above  Physical Exam  BP (!) 161/71 (BP Location: Left Arm)   Pulse 85   Temp 98.4 F (36.9 C) (Oral)   Resp 18   Ht 5' 4.5" (1.638 m)   Wt 90.3 kg   SpO2 97%   BMI 33.64 kg/m  Gen:   Awake, no distress   Resp:  Normal effort  MSK:   Moves extremities without difficulty  Other:  There is tenderness to the lumbar spine.  5/5 strength to the lower extremities.  Normal sensation to the lower extremities.  Medical Decision Making  Medically screening exam initiated at 1:18 PM.  Appropriate orders placed.  Katie Combs was informed that the remainder of the evaluation will be completed by another provider, this initial triage assessment does not replace that evaluation, and the importance of remaining in the ED until their evaluation is complete.  I spoke with Dr. Yevette Edwards who recommends admission to the hospitalist service for IV pain medication for pain control as she is unable to tolerate her p.o. medications at home secondary to pain.  She is unable to walk around.  He is currently working on getting OR time to possibly move her surgery up from 7/5 to sooner.  He will service consult and can contact him with any questions.   Honor Loh Zap, [place] 08/29/21 1323

## 2021-08-30 DIAGNOSIS — E871 Hypo-osmolality and hyponatremia: Secondary | ICD-10-CM

## 2021-08-30 DIAGNOSIS — I10 Essential (primary) hypertension: Secondary | ICD-10-CM | POA: Diagnosis not present

## 2021-08-30 DIAGNOSIS — M5442 Lumbago with sciatica, left side: Secondary | ICD-10-CM

## 2021-08-30 DIAGNOSIS — F419 Anxiety disorder, unspecified: Secondary | ICD-10-CM | POA: Diagnosis not present

## 2021-08-30 DIAGNOSIS — K5900 Constipation, unspecified: Secondary | ICD-10-CM

## 2021-08-30 DIAGNOSIS — N1831 Chronic kidney disease, stage 3a: Secondary | ICD-10-CM | POA: Diagnosis not present

## 2021-08-30 LAB — GLUCOSE, CAPILLARY
Glucose-Capillary: 139 mg/dL — ABNORMAL HIGH (ref 70–99)
Glucose-Capillary: 177 mg/dL — ABNORMAL HIGH (ref 70–99)
Glucose-Capillary: 142 mg/dL — ABNORMAL HIGH (ref 70–99)

## 2021-08-30 LAB — BASIC METABOLIC PANEL
Anion gap: 10 (ref 5–15)
BUN: 27 mg/dL — ABNORMAL HIGH (ref 8–23)
CO2: 26 mmol/L (ref 22–32)
Calcium: 10.2 mg/dL (ref 8.9–10.3)
Chloride: 95 mmol/L — ABNORMAL LOW (ref 98–111)
Creatinine, Ser: 1.43 mg/dL — ABNORMAL HIGH (ref 0.44–1.00)
GFR, Estimated: 40 mL/min — ABNORMAL LOW (ref >60)
Glucose, Bld: 112 mg/dL — ABNORMAL HIGH (ref 70–99)
Potassium: 4 mmol/L (ref 3.5–5.1)
Sodium: 131 mmol/L — ABNORMAL LOW (ref 135–145)

## 2021-08-30 LAB — CBC
HCT: 45.7 % (ref 36.0–46.0)
Hemoglobin: 15 g/dL (ref 12.0–15.0)
MCH: 27.3 pg (ref 26.0–34.0)
MCHC: 32.8 g/dL (ref 30.0–36.0)
MCV: 83.2 fL (ref 80.0–100.0)
Platelets: 363 10*3/uL (ref 150–400)
RBC: 5.49 MIL/uL — ABNORMAL HIGH (ref 3.87–5.11)
RDW: 13.9 % (ref 11.5–15.5)
WBC: 11.8 10*3/uL — ABNORMAL HIGH (ref 4.0–10.5)
nRBC: 0 % (ref 0.0–0.2)

## 2021-08-30 LAB — HEMOGLOBIN A1C
Hgb A1c MFr Bld: 6.3 % — ABNORMAL HIGH (ref 4.8–5.6)
Mean Plasma Glucose: 134.11 mg/dL

## 2021-08-30 LAB — HIV ANTIBODY (ROUTINE TESTING W REFLEX): HIV Screen 4th Generation wRfx: NONREACTIVE

## 2021-08-30 MED ORDER — ENOXAPARIN SODIUM 30 MG/0.3ML IJ SOSY
30.0000 mg | PREFILLED_SYRINGE | INTRAMUSCULAR | Status: DC
  Administered 2021-08-30: 30 mg via SUBCUTANEOUS
  Filled 2021-08-30: qty 0.3

## 2021-08-30 MED ORDER — SORBITOL 70 % SOLN
30.0000 mL | Status: AC
  Administered 2021-08-30 (×2): 30 mL via ORAL
  Filled 2021-08-30 (×2): qty 30

## 2021-08-30 MED ORDER — SENNOSIDES-DOCUSATE SODIUM 8.6-50 MG PO TABS
1.0000 | ORAL_TABLET | Freq: Two times a day (BID) | ORAL | Status: DC
  Administered 2021-08-30 – 2021-08-31 (×2): 1 via ORAL
  Filled 2021-08-30 (×2): qty 1

## 2021-08-30 MED ORDER — PANTOPRAZOLE SODIUM 40 MG PO TBEC
40.0000 mg | DELAYED_RELEASE_TABLET | Freq: Every day | ORAL | Status: DC
  Administered 2021-08-30 – 2021-09-02 (×4): 40 mg via ORAL
  Filled 2021-08-30 (×4): qty 1

## 2021-08-30 MED ORDER — IBUPROFEN 600 MG PO TABS
600.0000 mg | ORAL_TABLET | Freq: Three times a day (TID) | ORAL | Status: DC
  Administered 2021-08-30: 600 mg via ORAL
  Filled 2021-08-30: qty 1

## 2021-08-30 MED ORDER — SODIUM CHLORIDE 0.9 % IV SOLN: INTRAVENOUS | Status: DC

## 2021-08-30 MED ORDER — POLYETHYLENE GLYCOL 3350 17 G PO PACK
17.0000 g | PACK | Freq: Two times a day (BID) | ORAL | Status: DC
  Administered 2021-08-30: 17 g via ORAL
  Filled 2021-08-30 (×2): qty 1

## 2021-08-30 NOTE — Plan of Care (Signed)
  Problem: Nutrition: Goal: Adequate nutrition will be maintained Outcome: Progressing   Problem: Pain Managment: Goal: General experience of comfort will improve Outcome: Progressing   Problem: Safety: Goal: Ability to remain free from injury will improve Outcome: Progressing   

## 2021-08-31 ENCOUNTER — Encounter (HOSPITAL_COMMUNITY): Payer: Self-pay | Admitting: Family Medicine

## 2021-08-31 DIAGNOSIS — M5442 Lumbago with sciatica, left side: Secondary | ICD-10-CM | POA: Diagnosis not present

## 2021-08-31 DIAGNOSIS — F419 Anxiety disorder, unspecified: Secondary | ICD-10-CM | POA: Diagnosis not present

## 2021-08-31 DIAGNOSIS — N1831 Chronic kidney disease, stage 3a: Secondary | ICD-10-CM | POA: Diagnosis not present

## 2021-08-31 DIAGNOSIS — I10 Essential (primary) hypertension: Secondary | ICD-10-CM | POA: Diagnosis not present

## 2021-08-31 LAB — CBC
HCT: 42 % (ref 36.0–46.0)
Hemoglobin: 13.5 g/dL (ref 12.0–15.0)
MCH: 27.4 pg (ref 26.0–34.0)
MCHC: 32.1 g/dL (ref 30.0–36.0)
MCV: 85.2 fL (ref 80.0–100.0)
Platelets: 308 10*3/uL (ref 150–400)
RBC: 4.93 MIL/uL (ref 3.87–5.11)
RDW: 14.1 % (ref 11.5–15.5)
WBC: 11.3 10*3/uL — ABNORMAL HIGH (ref 4.0–10.5)
nRBC: 0 % (ref 0.0–0.2)

## 2021-08-31 LAB — BASIC METABOLIC PANEL
Anion gap: 11 (ref 5–15)
BUN: 35 mg/dL — ABNORMAL HIGH (ref 8–23)
CO2: 24 mmol/L (ref 22–32)
Calcium: 9.6 mg/dL (ref 8.9–10.3)
Chloride: 95 mmol/L — ABNORMAL LOW (ref 98–111)
Creatinine, Ser: 1.52 mg/dL — ABNORMAL HIGH (ref 0.44–1.00)
GFR, Estimated: 37 mL/min — ABNORMAL LOW (ref >60)
Glucose, Bld: 117 mg/dL — ABNORMAL HIGH (ref 70–99)
Potassium: 4.4 mmol/L (ref 3.5–5.1)
Sodium: 130 mmol/L — ABNORMAL LOW (ref 135–145)

## 2021-08-31 LAB — GLUCOSE, CAPILLARY
Glucose-Capillary: 102 mg/dL — ABNORMAL HIGH (ref 70–99)
Glucose-Capillary: 137 mg/dL — ABNORMAL HIGH (ref 70–99)

## 2021-08-31 MED ORDER — SENNOSIDES-DOCUSATE SODIUM 8.6-50 MG PO TABS
1.0000 | ORAL_TABLET | Freq: Every day | ORAL | Status: DC
  Administered 2021-09-01 – 2021-09-03 (×3): 1 via ORAL
  Filled 2021-08-31 (×3): qty 1

## 2021-08-31 MED ORDER — LORATADINE 10 MG PO TABS: 10.0000 mg | ORAL_TABLET | Freq: Every day | ORAL | Status: DC | PRN

## 2021-08-31 MED ORDER — ENOXAPARIN SODIUM 40 MG/0.4ML IJ SOSY
40.0000 mg | PREFILLED_SYRINGE | INTRAMUSCULAR | Status: DC
  Administered 2021-08-31: 40 mg via SUBCUTANEOUS
  Filled 2021-08-31: qty 0.4

## 2021-08-31 MED ORDER — POLYETHYLENE GLYCOL 3350 17 G PO PACK
17.0000 g | PACK | Freq: Every day | ORAL | Status: DC
  Administered 2021-09-01: 17 g via ORAL
  Filled 2021-08-31 (×2): qty 1

## 2021-08-31 MED ORDER — ASPIRIN 81 MG PO TBEC
81.0000 mg | DELAYED_RELEASE_TABLET | Freq: Every day | ORAL | Status: DC
  Administered 2021-09-02: 81 mg via ORAL
  Filled 2021-08-31 (×3): qty 1

## 2021-08-31 MED ORDER — COQ10 100 MG PO CAPS: 100.0000 mg | ORAL_CAPSULE | Freq: Every day | ORAL | Status: DC

## 2021-08-31 MED ORDER — NYSTATIN-TRIAMCINOLONE 100000-0.1 UNIT/GM-% EX CREA
1.0000 | TOPICAL_CREAM | Freq: Two times a day (BID) | CUTANEOUS | Status: DC | PRN
Start: 2021-08-31 — End: 2021-09-04

## 2021-08-31 MED ORDER — TIZANIDINE HCL 4 MG PO TABS
2.0000 mg | ORAL_TABLET | Freq: Every day | ORAL | Status: DC
  Administered 2021-09-02 – 2021-09-03 (×2): 2 mg via ORAL
  Filled 2021-08-31 (×4): qty 1

## 2021-08-31 MED ORDER — ADULT MULTIVITAMIN W/MINERALS CH
1.0000 | ORAL_TABLET | Freq: Every day | ORAL | Status: DC
  Administered 2021-08-31 – 2021-09-04 (×5): 1 via ORAL
  Filled 2021-08-31 (×5): qty 1

## 2021-08-31 MED ORDER — KETOCONAZOLE 2 % EX CREA: 1.0000 | TOPICAL_CREAM | Freq: Two times a day (BID) | CUTANEOUS | Status: DC | PRN

## 2021-08-31 MED ORDER — LIDOCAINE VISCOUS HCL 2 % MT SOLN
15.0000 mL | OROMUCOSAL | Status: DC | PRN
Start: 2021-08-31 — End: 2021-09-04

## 2021-08-31 MED ORDER — LACTATED RINGERS IV SOLN
INTRAVENOUS | Status: DC
  Administered 2021-09-01: 900 mL via INTRAVENOUS

## 2021-08-31 MED ORDER — LORATADINE 10 MG PO TABS
10.0000 mg | ORAL_TABLET | Freq: Every day | ORAL | Status: DC
  Administered 2021-08-31 – 2021-09-04 (×5): 10 mg via ORAL
  Filled 2021-08-31 (×5): qty 1

## 2021-08-31 MED ORDER — HYDROMORPHONE HCL 1 MG/ML IJ SOLN
1.0000 mg | INTRAMUSCULAR | Status: DC | PRN
  Administered 2021-08-31 – 2021-09-01 (×6): 2 mg via INTRAVENOUS
  Administered 2021-09-01 (×2): 1 mg via INTRAVENOUS
  Administered 2021-09-02 (×3): 2 mg via INTRAVENOUS
  Filled 2021-08-31: qty 1
  Filled 2021-08-31 (×2): qty 2
  Filled 2021-08-31 (×2): qty 1
  Filled 2021-08-31 (×2): qty 2
  Filled 2021-08-31 (×3): qty 1
  Filled 2021-08-31 (×3): qty 2

## 2021-08-31 MED ORDER — OXYCODONE-ACETAMINOPHEN 5-325 MG PO TABS
1.0000 | ORAL_TABLET | ORAL | Status: DC | PRN
  Administered 2021-08-31 – 2021-09-04 (×19): 2 via ORAL
  Filled 2021-08-31 (×18): qty 2

## 2021-08-31 NOTE — Progress Notes (Addendum)
PROGRESS NOTE    Katie Combs  BJY:[id number] DOB: 02-Jul-1950 DOA: 08/29/2021 PCP: Cannon Kettle, PA-C   Chief Complaint  Patient presents with   Back Pain    Brief Narrative:  HPI per Dr. Gayla Doss is a pleasant 71 y.o. female with medical history significant for diet-controlled diabetes mellitus, hypertension, anxiety, pudendal neuralgia, and chronic low back pain who presents to the emergency department with severe worsening in her chronic low back pain.  Patient describes chronic low back pain with radiation down the left leg for which she follows with a spine surgeon and underwent today transforaminal lumbar interbody fusion in 2020.  She has been experiencing increased pain from her low back and left hip with radiation down to her calf.  She has been unable to sit or ambulate without a walker for the past week due to this.  She describes some chronic left leg weakness and feels that that has worsened slightly but she attributes her new mobility problems primarily to pain.  She denies any fevers or chills, change in bowel or bladder control, or new saddle anesthesia.   ED Course: Upon arrival to the ED, patient is found to be afebrile and saturating well on room air with stable blood pressure.  Plain films of the lumbar spine are negative for acute findings.  ED discussed the case with Dr. Yevette Edwards of orthopedic surgery who recommended admission for pain control and indicated that the may be able to expedite her surgery that had been planned to take place in 2 weeks.    Assessment & Plan:   Principal Problem:   Acute left-sided low back pain with sciatica Active Problems:   Prediabetes   Hypertension   Anxiety   Stage 3a chronic kidney disease (CKD) (HCC)   Hyponatremia   Constipation  #1 intractable back pain/acute on chronic low back pain with radiculopathy -Patient describes chronic neurological deficits but no change in bowel or bladder control or new saddle  anesthesia or no signs of infectious etiology on presentation. -Patient with worsening low back pain radiating to the left lower extremity affecting ADLs. -Patient noted to have an upcoming L3-L4 interbody fusion and decompression which per patient is going to be expedited and performed this admission per ED discussion with her surgeon. -Continue current pain control with home regimen of gabapentin, Cymbalta.  -Increase Dilaudid to 1 to 2 mg as needed.   -Orthopedics following and appreciate input and recommendations.    2.  Constipation -Patient given sorbitol x2 doses on 08/30/2021 with good results.  -Patient currently having multiple bowel movements.   -Decrease MiraLAX and Senokot S to daily.   -Follow.  3.  Prediabetes  -Patient denies any history of diabetes, stated she is prediabetic.  -Asking for carb modified diet to be changed as well as distal continuation of CBGs.   -Hemoglobin A1c noted to be 6.16 August 2019. -Repeat hemoglobin A1c 6.3 (08/30/2021). -CBG 102. -Discontinue CBGs, change diet to a regular diet, discontinue SSI.  4.  Hyperlipidemia -Continue statin.  5.  Hypertension Continue Bystolic. -BP was borderline but has since improved.  -Continue to hold lisinopril with bump in creatinine. -If further blood pressure control is needed may consider starting patient on hydralazine 25 mg twice daily. -Follow.  6.  CKD stage IIIa -Creatinine today noted at 1.52 from 1.43 was 1.24 on admission.  Baseline unknown. -Blood pressure was borderline yesterday, currently improved.  -Patient with a bump in creatinine.   -Continue  to hold ACE inhibitor. -Check a UA, urine sodium, urine creatinine.   -IV fluids.   -Strict I's and O's.   -Repeat labs in the AM.   7.  Anxiety -Continue home regimen Cymbalta, Klonopin  8.  Hyponatremia -Urinalysis ordered on 08/30/2021 with urine studies however not done and still pending.   -IV fluids.   -Repeat labs in the AM.    DVT  prophylaxis: Lovenox Code Status: Full Family Communication: Updated patient and husband at bedside. Disposition: TBD/per orthopedics.  Status is: Inpatient Remains inpatient appropriate because: Severity of illness   Consultants:  Orthopedics: Dr. Levon Hedger 08/30/2021  Procedures:  Plain films of the L-spine 08/29/2021   Antimicrobials:  None   Subjective: Patient laying in bed on the right side.  No chest pain.  No shortness of breath.  No abdominal pain.  Still with complaints of left lower extremity pain radiating from the buttocks down to the left calf.  States had bowel movement after sorbitol was given yesterday.  Having multiple stools.  States she is prediabetic and not diabetic and would like diet change and CBGs discontinued.    Objective: Vitals:   08/30/21 1716 08/30/21 2103 08/31/21 0340 08/31/21 0906  BP: 126/73 111/62 135/65 (!) 155/76  Pulse: 88 77 73 75  Resp: 18 17 18 18   Temp: (!) 97.5 F (36.4 C) 98.1 F (36.7 C) 98.2 F (36.8 C) 98.1 F (36.7 C)  TempSrc: Oral Oral Oral Oral  SpO2: 99% 98% 99% 100%  Weight:      Height:        Intake/Output Summary (Last 24 hours) at 08/31/2021 1424 Last data filed at 08/31/2021 4403 Gross per 24 hour  Intake 2305.39 ml  Output --  Net 2305.39 ml   Filed Weights   08/29/21 1239  Weight: 90.3 kg    Examination:  General exam: NAD Respiratory system: CTA B.  No wheezes, no crackles, no rhonchi.  Fair air movement.   Cardiovascular system: Regular rate rhythm no murmurs rubs or gallops.  No JVD.  No lower extremity edema.   Gastrointestinal system: Abdomen is soft, nontender, nondistended, positive bowel sounds.  No rebound.  No guarding. Central nervous system: Alert and oriented. No focal neurological deficits. Extremities: Symmetric 5 x 5 power.  Right buttocks and right lower extremity up to calf tender to palpation Skin: No rashes, lesions or ulcers Psychiatry: Judgement and insight appear normal. Mood &  affect appropriate.     Data Reviewed: I have personally reviewed following labs and imaging studies  CBC: Recent Labs  Lab 08/29/21 1819 08/30/21 0450 08/31/21 0208  WBC 10.6* 11.8* 11.3*  NEUTROABS 8.3*  --   --   HGB 16.3* 15.0 13.5  HCT 48.2* 45.7 42.0  MCV 83.1 83.2 85.2  PLT 371 363 308    Basic Metabolic Panel: Recent Labs  Lab 08/29/21 1819 08/30/21 0450 08/31/21 0208  NA 130* 131* 130*  K 4.1 4.0 4.4  CL 93* 95* 95*  CO2 22 26 24   GLUCOSE 131* 112* 117*  BUN 24* 27* 35*  CREATININE 1.24* 1.43* 1.52*  CALCIUM 10.5* 10.2 9.6    GFR: Estimated Creatinine Clearance: 38.4 mL/min (A) (by C-G formula based on SCr of 1.52 mg/dL (H)).  Liver Function Tests: No results for input(s): "AST", "ALT", "ALKPHOS", "BILITOT", "PROT", "ALBUMIN" in the last 168 hours.  CBG: Recent Labs  Lab 08/30/21 1234 08/30/21 1838 08/30/21 2138 08/31/21 0908 08/31/21 1145  GLUCAP 139* 177* 142* 102* 137*  No results found for this or any previous visit (from the past 240 hour(s)).       Radiology Studies: No results found.      Scheduled Meds:  aspirin EC  81 mg Oral Daily   clonazePAM  1 mg Oral QHS   CoQ10  100 mg Oral Daily   DULoxetine  30 mg Oral Daily   enoxaparin (LOVENOX) injection  30 mg Subcutaneous Q24H   gabapentin  1,600 mg Oral q AM   And   gabapentin  800 mg Oral QHS   lidocaine  15 mL Mouth/Throat See admin instructions   loratadine  10 mg Oral Daily   multivitamin with minerals  1 tablet Oral Daily   nebivolol  5 mg Oral QHS   pantoprazole  40 mg Oral Q0600   [START ON 09/01/2021] polyethylene glycol  17 g Oral Daily   rosuvastatin  2.5 mg Oral QHS   [START ON 09/01/2021] senna-docusate  1 tablet Oral QHS   tiZANidine  2 mg Oral QHS   Continuous Infusions:  lactated ringers       LOS: 2 days    Time spent: 35 minutes    Ramiro Harvest, MD [facility]   To contact the attending provider between 7A-7P or the covering  provider during after hours 7P-7A, please log into the web site [email or web address] and access using universal [place] password for that web site. If you do not have the password, please call the hospital operator.  08/31/2021, 2:24 PM

## 2021-09-01 DIAGNOSIS — N1831 Chronic kidney disease, stage 3a: Secondary | ICD-10-CM | POA: Diagnosis not present

## 2021-09-01 DIAGNOSIS — M5442 Lumbago with sciatica, left side: Secondary | ICD-10-CM | POA: Diagnosis not present

## 2021-09-01 DIAGNOSIS — F419 Anxiety disorder, unspecified: Secondary | ICD-10-CM | POA: Diagnosis not present

## 2021-09-01 DIAGNOSIS — I10 Essential (primary) hypertension: Secondary | ICD-10-CM | POA: Diagnosis not present

## 2021-09-01 LAB — SODIUM, URINE, RANDOM: Sodium, Ur: 74 mmol/L

## 2021-09-01 LAB — URINALYSIS, ROUTINE W REFLEX MICROSCOPIC
Bilirubin Urine: NEGATIVE
Glucose, UA: NEGATIVE mg/dL
Hgb urine dipstick: NEGATIVE
Ketones, ur: NEGATIVE mg/dL
Leukocytes,Ua: NEGATIVE
Nitrite: NEGATIVE
Protein, ur: NEGATIVE mg/dL
Specific Gravity, Urine: 1.015 (ref 1.005–1.030)
pH: 5.5 (ref 5.0–8.0)

## 2021-09-01 LAB — RENAL FUNCTION PANEL
Albumin: 3.2 g/dL — ABNORMAL LOW (ref 3.5–5.0)
Anion gap: 5 (ref 5–15)
BUN: 28 mg/dL — ABNORMAL HIGH (ref 8–23)
CO2: 25 mmol/L (ref 22–32)
Calcium: 8.9 mg/dL (ref 8.9–10.3)
Chloride: 99 mmol/L (ref 98–111)
Creatinine, Ser: 1.14 mg/dL — ABNORMAL HIGH (ref 0.44–1.00)
GFR, Estimated: 52 mL/min — ABNORMAL LOW (ref >60)
Glucose, Bld: 105 mg/dL — ABNORMAL HIGH (ref 70–99)
Phosphorus: 3.2 mg/dL (ref 2.5–4.6)
Potassium: 3.6 mmol/L (ref 3.5–5.1)
Sodium: 129 mmol/L — ABNORMAL LOW (ref 135–145)

## 2021-09-01 LAB — CBC
HCT: 36.8 % (ref 36.0–46.0)
Hemoglobin: 11.9 g/dL — ABNORMAL LOW (ref 12.0–15.0)
MCH: 27.4 pg (ref 26.0–34.0)
MCHC: 32.3 g/dL (ref 30.0–36.0)
MCV: 84.6 fL (ref 80.0–100.0)
Platelets: 268 10*3/uL (ref 150–400)
RBC: 4.35 MIL/uL (ref 3.87–5.11)
RDW: 13.7 % (ref 11.5–15.5)
WBC: 8.1 10*3/uL (ref 4.0–10.5)
nRBC: 0 % (ref 0.0–0.2)

## 2021-09-01 LAB — CREATININE, URINE, RANDOM: Creatinine, Urine: 28.92 mg/dL

## 2021-09-01 MED ORDER — ENSURE PRE-SURGERY PO LIQD
296.0000 mL | Freq: Once | ORAL | Status: AC
  Administered 2021-09-02: 296 mL via ORAL
  Filled 2021-09-01: qty 296

## 2021-09-01 MED ORDER — HYDRALAZINE HCL 10 MG PO TABS
25.0000 mg | ORAL_TABLET | Freq: Two times a day (BID) | ORAL | Status: DC
  Administered 2021-09-01 – 2021-09-04 (×6): 25 mg via ORAL
  Filled 2021-09-01 (×2): qty 3
  Filled 2021-09-01: qty 1
  Filled 2021-09-01: qty 3
  Filled 2021-09-01: qty 1
  Filled 2021-09-01: qty 3

## 2021-09-01 NOTE — Progress Notes (Signed)
PROGRESS NOTE    Katie Combs  ZOX:[id number] DOB: [birth year] DOA: 08/29/2021 PCP: System, Provider Not In   Chief Complaint  Patient presents with   Back Pain    Brief Narrative:  HPI per Dr. Gayla Doss is a pleasant 71 y.o. female with medical history significant for diet-controlled diabetes mellitus, hypertension, anxiety, pudendal neuralgia, and chronic low back pain who presents to the emergency department with severe worsening in her chronic low back pain.  Patient describes chronic low back pain with radiation down the left leg for which she follows with a spine surgeon and underwent today transforaminal lumbar interbody fusion in 2020.  She has been experiencing increased pain from her low back and left hip with radiation down to her calf.  She has been unable to sit or ambulate without a walker for the past week due to this.  She describes some chronic left leg weakness and feels that that has worsened slightly but she attributes her new mobility problems primarily to pain.  She denies any fevers or chills, change in bowel or bladder control, or new saddle anesthesia.   ED Course: Upon arrival to the ED, patient is found to be afebrile and saturating well on room air with stable blood pressure.  Plain films of the lumbar spine are negative for acute findings.  ED discussed the case with Dr. Yevette Edwards of orthopedic surgery who recommended admission for pain control and indicated that the may be able to expedite her surgery that had been planned to take place in 2 weeks.    Assessment & Plan:   Principal Problem:   Acute left-sided low back pain with sciatica Active Problems:   Prediabetes   Hypertension   Anxiety   Stage 3a chronic kidney disease (CKD) (HCC)   Hyponatremia   Constipation  #1 intractable back pain/acute on chronic low back pain with radiculopathy -Patient describes chronic neurological deficits but no change in bowel or bladder control or new saddle  anesthesia or no signs of infectious etiology on presentation. -Patient with worsening low back pain radiating to the left lower extremity affecting ADLs. -Patient noted to have an upcoming L3-L4 interbody fusion and decompression which per patient is going to be expedited and performed this admission per ED discussion with her surgeon. -Continue current pain control with home regimen of gabapentin, Cymbalta.  -Patient is scheduled for surgery tomorrow 09/02/2021 at 2 PM per orthopedics. -Orthopedics following and appreciate input and recommendations.    2.  Constipation -Patient given sorbitol x2 doses on 08/30/2021 with good results.  -MiraLAX and Senokot-S decreased to daily.   -Follow.  3.  Prediabetes  -Patient denies any history of diabetes, stated she is prediabetic.  -Asking for carb modified diet to be changed as well as distal continuation of CBGs.   -Hemoglobin A1c noted to be 6.16 August 2019. -Repeat hemoglobin A1c 6.3 (08/30/2021). -CBG 105 on BMET. -Discontinued CBGs, changed diet to a regular diet, discontinued SSI.  4.  Hyperlipidemia -Continue statin.  5.  Hypertension Continue Bystolic. -BP was borderline but has since improved.  -Continue to hold lisinopril with bump in creatinine. -Start hydralazine 25 mg twice daily for better blood pressure control.   -Follow.  6.  CKD stage IIIa -Creatinine today noted at 1.14 from 1.52 from 1.43 was 1.24 on admission.  Baseline unknown. -Blood pressure was borderline 08/30/2021 however improved with hydration. -Patient noted to have a bump in her creatinine which has improved.  -Continue to hold  ACE inhibitor. -Urinalysis, urine electrolytes ordered however still pending.  -Saline lock IV fluids  -Strict I's and O's, daily weights  -Repeat labs in the AM.   7.  Anxiety -Continue home regimen Cymbalta, Klonopin  8.  Hyponatremia -Urinalysis ordered on 08/30/2021 with urine studies however not done and still pending.   -Saline  lock IV fluids.   -Repeat labs in the AM.    DVT prophylaxis: Lovenox Code Status: Full Family Communication: Updated patient and sister at bedside.   Disposition: TBD/per orthopedics.  Status is: Inpatient Remains inpatient appropriate because: Severity of illness   Consultants:  Orthopedics: Dr. Levon Hedger 08/30/2021  Procedures:  Plain films of the L-spine 08/29/2021   Antimicrobials:  None   Subjective: Patient laying in bed.  Worked with physical therapy.  Some complaints of left lower extremity pain.  No abdominal pain.  No nausea or vomiting.  Tolerating diet.  Sister at bedside.    Objective: Vitals:   08/31/21 1714 08/31/21 2103 09/01/21 0548 09/01/21 0727  BP: (!) 162/74 (!) 171/73 (!) 163/83 (!) 148/69  Pulse: 77 84 70 72  Resp: 20 16 16 16   Temp: 98 F (36.7 C) 98.1 F (36.7 C) 97.7 F (36.5 C) 98.2 F (36.8 C)  TempSrc: Oral Oral Oral Oral  SpO2: 100% 98% 99% 97%  Weight:      Height:        Intake/Output Summary (Last 24 hours) at 09/01/2021 1209 Last data filed at 08/31/2021 1802 Gross per 24 hour  Intake 1458.48 ml  Output --  Net 1458.48 ml    Filed Weights   08/29/21 1239  Weight: 90.3 kg    Examination:  General exam: NAD. Respiratory system: CTA B.  No wheezes, no crackles, no rhonchi.  Fair air movement.   Cardiovascular system: RRR no murmurs rubs or gallops.  No JVD.  No lower extremity edema.  Gastrointestinal system: Abdomen is soft, nontender, nondistended, positive bowel sounds.  No rebound.  No guarding.   Central nervous system: Alert and oriented. No focal neurological deficits. Extremities: Symmetric 5 x 5 power.  Right buttocks and right lower extremity up to calf tender to palpation Skin: No rashes, lesions or ulcers Psychiatry: Judgement and insight appear normal. Mood & affect appropriate.     Data Reviewed: I have personally reviewed following labs and imaging studies  CBC: Recent Labs  Lab 08/29/21 1819  08/30/21 0450 08/31/21 0208 09/01/21 0040  WBC 10.6* 11.8* 11.3* 8.1  NEUTROABS 8.3*  --   --   --   HGB 16.3* 15.0 13.5 11.9*  HCT 48.2* 45.7 42.0 36.8  MCV 83.1 83.2 85.2 84.6  PLT 371 363 308 268     Basic Metabolic Panel: Recent Labs  Lab 08/29/21 1819 08/30/21 0450 08/31/21 0208 09/01/21 0040  NA 130* 131* 130* 129*  K 4.1 4.0 4.4 3.6  CL 93* 95* 95* 99  CO2 22 26 24 25   GLUCOSE 131* 112* 117* 105*  BUN 24* 27* 35* 28*  CREATININE 1.24* 1.43* 1.52* 1.14*  CALCIUM 10.5* 10.2 9.6 8.9  PHOS  --   --   --  3.2     GFR: Estimated Creatinine Clearance: 51.2 mL/min (A) (by C-G formula based on SCr of 1.14 mg/dL (H)).  Liver Function Tests: Recent Labs  Lab 09/01/21 0040  ALBUMIN 3.2*    CBG: Recent Labs  Lab 08/30/21 1234 08/30/21 1838 08/30/21 2138 08/31/21 0908 08/31/21 1145  GLUCAP 139* 177* 142* 102* 137*  No results found for this or any previous visit (from the past 240 hour(s)).       Radiology Studies: No results found.      Scheduled Meds:  aspirin EC  81 mg Oral Daily   clonazePAM  1 mg Oral QHS   DULoxetine  30 mg Oral Daily   enoxaparin (LOVENOX) injection  40 mg Subcutaneous Q24H   gabapentin  1,600 mg Oral q AM   And   gabapentin  800 mg Oral QHS   loratadine  10 mg Oral Daily   multivitamin with minerals  1 tablet Oral Daily   nebivolol  5 mg Oral QHS   pantoprazole  40 mg Oral Q0600   polyethylene glycol  17 g Oral Daily   rosuvastatin  2.5 mg Oral QHS   senna-docusate  1 tablet Oral QHS   tiZANidine  2 mg Oral QHS   Continuous Infusions:     LOS: 3 days    Time spent: 35 minutes    Ramiro Harvest, MD [facility]   To contact the attending provider between 7A-7P or the covering provider during after hours 7P-7A, please log into the web site [email or web address] and access using universal [place] password for that web site. If you do not have the password, please call the hospital  operator.  09/01/2021, 12:09 PM

## 2021-09-01 NOTE — Progress Notes (Signed)
CHIEF COMPLAINT: Left leg pain and weakness  HISTORY OF PRESENT ILLNESS: Patient continues to have ongoing severe pain and weakness in the left leg.  She is obtaining IV pain medication for this, which does help alleviate her pain somewhat, but she does continue to feel quite miserable.  She is very much looking forward to surgery to address this.  She has now had miserable pain for the last 6 weeks.  We did previously plan to proceed with surgery next week, but she did call stating that she had significant difficulty getting around the house, and even using the bathroom.  She was subsequently admitted for pain control. Review of systems reviewed thoroughly and all other systems are negative as related to the chief complaint.  BP (!) 148/69 (BP Location: Left Arm)   Pulse 72   Temp 98.2 F (36.8 C) (Oral)   Resp 16   Ht 5' 4.5" (1.638 m)   Wt 90.3 kg   SpO2 97%   BMI 33.64 kg/m    PHYSICAL EXAM: Patient does continue to appear quite miserable.  Manual muscle testing continues to be difficult, given her prominent pain.  ASSESSMENT: 1.  Ongoing left-sided lumbar radiculopathy secondary to a left-sided L3-L4 disc herniation, and severe left-sided segmental collapse across L3-L4.  This is the level above her fusion at L4-L5.  PLAN:  I very much appreciate the assistance of the medicine team in helping to control Ms. Walthall's pain, and their assistance of addressing her chronic medical conditions. The plan at this point time is to continue pain management, and to place her at nothing by mouth at midnight tonight, in anticipation for what is currently scheduled to be a 2 PM surgery tomorrow, on 6/27.  My thoughts as outlined above were discussed with the patient and her husband today.  I did also discuss her situation with the hospitalist currently overseeing her care, Dr. Janee Morn.  I let him know that I would be happy to take her onto my service after her surgery tomorrow, but would asked that  he continue to follow her for her medical conditions, as she did have various questions about her blood pressure medications today.  I will see her next tomorrow prior to her surgery.  Electronically verified by:                                                       Estill Bamberg, MD

## 2021-09-02 ENCOUNTER — Inpatient Hospital Stay (HOSPITAL_COMMUNITY): Payer: Medicare PPO

## 2021-09-02 ENCOUNTER — Inpatient Hospital Stay (HOSPITAL_COMMUNITY): Payer: Medicare PPO | Admitting: Anesthesiology

## 2021-09-02 ENCOUNTER — Ambulatory Visit (HOSPITAL_COMMUNITY): Admission: RE | Admit: 2021-09-02 | Payer: Medicare PPO | Source: Home / Self Care | Admitting: Orthopedic Surgery

## 2021-09-02 ENCOUNTER — Encounter (HOSPITAL_COMMUNITY)
Admission: EM | Disposition: A | Discharge status: Home Health | Payer: Self-pay | Source: Ambulatory Visit | Attending: Orthopedic Surgery

## 2021-09-02 ENCOUNTER — Encounter (HOSPITAL_COMMUNITY): Payer: Self-pay | Admitting: Family Medicine

## 2021-09-02 ENCOUNTER — Other Ambulatory Visit: Payer: Self-pay

## 2021-09-02 DIAGNOSIS — M5416 Radiculopathy, lumbar region: Secondary | ICD-10-CM | POA: Diagnosis not present

## 2021-09-02 DIAGNOSIS — M5442 Lumbago with sciatica, left side: Secondary | ICD-10-CM | POA: Diagnosis not present

## 2021-09-02 DIAGNOSIS — M48061 Spinal stenosis, lumbar region without neurogenic claudication: Secondary | ICD-10-CM | POA: Diagnosis not present

## 2021-09-02 DIAGNOSIS — M4856XA Collapsed vertebra, not elsewhere classified, lumbar region, initial encounter for fracture: Secondary | ICD-10-CM

## 2021-09-02 DIAGNOSIS — R7303 Prediabetes: Secondary | ICD-10-CM

## 2021-09-02 DIAGNOSIS — K5900 Constipation, unspecified: Secondary | ICD-10-CM | POA: Diagnosis not present

## 2021-09-02 DIAGNOSIS — I10 Essential (primary) hypertension: Secondary | ICD-10-CM | POA: Diagnosis not present

## 2021-09-02 DIAGNOSIS — F419 Anxiety disorder, unspecified: Secondary | ICD-10-CM | POA: Diagnosis not present

## 2021-09-02 HISTORY — PX: TRANSFORAMINAL LUMBAR INTERBODY FUSION (TLIF) WITH PEDICLE SCREW FIXATION 1 LEVEL: SHX6141

## 2021-09-02 LAB — URINE CULTURE: Culture: <10000 — AB

## 2021-09-02 LAB — CBC
HCT: 35.7 % — ABNORMAL LOW (ref 36.0–46.0)
Hemoglobin: 11.9 g/dL — ABNORMAL LOW (ref 12.0–15.0)
MCH: 27.9 pg (ref 26.0–34.0)
MCHC: 33.3 g/dL (ref 30.0–36.0)
MCV: 83.8 fL (ref 80.0–100.0)
Platelets: 264 10*3/uL (ref 150–400)
RBC: 4.26 MIL/uL (ref 3.87–5.11)
RDW: 13.6 % (ref 11.5–15.5)
WBC: 8.7 10*3/uL (ref 4.0–10.5)
nRBC: 0 % (ref 0.0–0.2)

## 2021-09-02 LAB — SURGICAL PCR SCREEN
MRSA, PCR: NEGATIVE
Staphylococcus aureus: POSITIVE — AB

## 2021-09-02 LAB — BASIC METABOLIC PANEL
Anion gap: 13 (ref 5–15)
BUN: 18 mg/dL (ref 8–23)
CO2: 25 mmol/L (ref 22–32)
Calcium: 9.7 mg/dL (ref 8.9–10.3)
Chloride: 100 mmol/L (ref 98–111)
Creatinine, Ser: 1.09 mg/dL — ABNORMAL HIGH (ref 0.44–1.00)
GFR, Estimated: 55 mL/min — ABNORMAL LOW (ref >60)
Glucose, Bld: 97 mg/dL (ref 70–99)
Potassium: 3.8 mmol/L (ref 3.5–5.1)
Sodium: 138 mmol/L (ref 135–145)

## 2021-09-02 LAB — GLUCOSE, CAPILLARY: Glucose-Capillary: 144 mg/dL — ABNORMAL HIGH (ref 70–99)

## 2021-09-02 SURGERY — TRANSFORAMINAL LUMBAR INTERBODY FUSION (TLIF) WITH PEDICLE SCREW FIXATION 1 LEVEL
Anesthesia: General | Laterality: Left

## 2021-09-02 MED ORDER — 0.9 % SODIUM CHLORIDE (POUR BTL) OPTIME
TOPICAL | Status: DC | PRN
  Administered 2021-09-02 (×2): 1000 mL

## 2021-09-02 MED ORDER — METHYLENE BLUE 1 % INJ SOLN
INTRAVENOUS | Status: AC
  Filled 2021-09-02: qty 10

## 2021-09-02 MED ORDER — ONDANSETRON HCL 4 MG/2ML IJ SOLN
INTRAMUSCULAR | Status: DC | PRN
  Administered 2021-09-02: 4 mg via INTRAVENOUS

## 2021-09-02 MED ORDER — POVIDONE-IODINE 7.5 % EX SOLN: Freq: Once | CUTANEOUS | Status: DC

## 2021-09-02 MED ORDER — DEXAMETHASONE SODIUM PHOSPHATE 10 MG/ML IJ SOLN
INTRAMUSCULAR | Status: DC | PRN
  Administered 2021-09-02: 10 mg via INTRAVENOUS

## 2021-09-02 MED ORDER — CHLORHEXIDINE GLUCONATE 0.12 % MT SOLN
15.0000 mL | Freq: Once | OROMUCOSAL | Status: AC
Start: 2021-09-02 — End: 2021-09-02

## 2021-09-02 MED ORDER — PROPOFOL 10 MG/ML IV BOLUS
INTRAVENOUS | Status: AC
  Filled 2021-09-02: qty 20

## 2021-09-02 MED ORDER — ALUM & MAG HYDROXIDE-SIMETH 200-200-20 MG/5ML PO SUSP
30.0000 mL | Freq: Four times a day (QID) | ORAL | Status: DC | PRN
  Administered 2021-09-03: 30 mL via ORAL
  Filled 2021-09-02: qty 30

## 2021-09-02 MED ORDER — CEFAZOLIN SODIUM-DEXTROSE 2-4 GM/100ML-% IV SOLN
2.0000 g | Freq: Three times a day (TID) | INTRAVENOUS | Status: AC
  Administered 2021-09-03 (×2): 2 g via INTRAVENOUS
  Filled 2021-09-02 (×2): qty 100

## 2021-09-02 MED ORDER — POTASSIUM CHLORIDE IN NACL 20-0.9 MEQ/L-% IV SOLN
INTRAVENOUS | Status: DC
Start: 2021-09-02 — End: 2021-09-04

## 2021-09-02 MED ORDER — BUPIVACAINE LIPOSOME 1.3 % IJ SUSP
INTRAMUSCULAR | Status: AC
  Filled 2021-09-02: qty 20

## 2021-09-02 MED ORDER — CEFAZOLIN SODIUM-DEXTROSE 2-4 GM/100ML-% IV SOLN
INTRAVENOUS | Status: AC
  Filled 2021-09-02: qty 100

## 2021-09-02 MED ORDER — CEFAZOLIN SODIUM-DEXTROSE 2-4 GM/100ML-% IV SOLN
2.0000 g | INTRAVENOUS | Status: AC
  Administered 2021-09-02 (×2): 2 g via INTRAVENOUS

## 2021-09-02 MED ORDER — SODIUM CHLORIDE 0.9% FLUSH: 3.0000 mL | INTRAVENOUS | Status: DC | PRN

## 2021-09-02 MED ORDER — ORAL CARE MOUTH RINSE: 15.0000 mL | Freq: Once | OROMUCOSAL | Status: AC

## 2021-09-02 MED ORDER — FENTANYL CITRATE (PF) 100 MCG/2ML IJ SOLN
25.0000 ug | INTRAMUSCULAR | Status: DC | PRN
  Administered 2021-09-02 (×2): 50 ug via INTRAVENOUS

## 2021-09-02 MED ORDER — ZOLPIDEM TARTRATE 5 MG PO TABS: 5.0000 mg | ORAL_TABLET | Freq: Every evening | ORAL | Status: DC | PRN

## 2021-09-02 MED ORDER — FENTANYL CITRATE (PF) 100 MCG/2ML IJ SOLN
INTRAMUSCULAR | Status: AC
  Filled 2021-09-02: qty 2

## 2021-09-02 MED ORDER — SODIUM CHLORIDE 0.9% FLUSH
3.0000 mL | Freq: Two times a day (BID) | INTRAVENOUS | Status: DC
Start: 2021-09-02 — End: 2021-09-04
  Administered 2021-09-02 – 2021-09-03 (×3): 3 mL via INTRAVENOUS

## 2021-09-02 MED ORDER — LACTATED RINGERS IV SOLN: INTRAVENOUS | Status: DC

## 2021-09-02 MED ORDER — MIDAZOLAM HCL 2 MG/2ML IJ SOLN
INTRAMUSCULAR | Status: AC
  Filled 2021-09-02: qty 2

## 2021-09-02 MED ORDER — ALBUMIN HUMAN 5 % IV SOLN: INTRAVENOUS | Status: DC | PRN

## 2021-09-02 MED ORDER — HYDRALAZINE HCL 25 MG PO TABS
25.0000 mg | ORAL_TABLET | Freq: Two times a day (BID) | ORAL | 1 refills | Status: AC
  Filled 2021-09-02 – 2021-09-24 (×2): qty 60, 30d supply, fill #0

## 2021-09-02 MED ORDER — LIDOCAINE 2% (20 MG/ML) 5 ML SYRINGE
INTRAMUSCULAR | Status: DC | PRN
  Administered 2021-09-02: 30 mg via INTRAVENOUS

## 2021-09-02 MED ORDER — SODIUM CHLORIDE 0.9 % IV SOLN: INTRAVENOUS | Status: DC

## 2021-09-02 MED ORDER — MENTHOL 3 MG MT LOZG: 1.0000 | LOZENGE | OROMUCOSAL | Status: DC | PRN

## 2021-09-02 MED ORDER — THROMBIN 20000 UNITS EX SOLR
CUTANEOUS | Status: AC
  Filled 2021-09-02: qty 20000

## 2021-09-02 MED ORDER — MIDAZOLAM HCL 2 MG/2ML IJ SOLN
INTRAMUSCULAR | Status: DC | PRN
  Administered 2021-09-02: 2 mg via INTRAVENOUS

## 2021-09-02 MED ORDER — BUPIVACAINE LIPOSOME 1.3 % IJ SUSP
INTRAMUSCULAR | Status: DC | PRN
  Administered 2021-09-02: 20 mL

## 2021-09-02 MED ORDER — MUPIROCIN 2 % EX OINT
1.0000 | TOPICAL_OINTMENT | Freq: Two times a day (BID) | CUTANEOUS | Status: DC
  Administered 2021-09-02 (×2): 1 via NASAL
  Filled 2021-09-02 (×2): qty 22

## 2021-09-02 MED ORDER — FENTANYL CITRATE (PF) 250 MCG/5ML IJ SOLN
INTRAMUSCULAR | Status: AC
  Filled 2021-09-02: qty 5

## 2021-09-02 MED ORDER — PROPOFOL 10 MG/ML IV BOLUS
INTRAVENOUS | Status: DC | PRN
  Administered 2021-09-02: 150 mg via INTRAVENOUS

## 2021-09-02 MED ORDER — ACETAMINOPHEN 325 MG PO TABS: 650.0000 mg | ORAL_TABLET | ORAL | Status: DC | PRN

## 2021-09-02 MED ORDER — LACTATED RINGERS IV SOLN: INTRAVENOUS | Status: DC | PRN

## 2021-09-02 MED ORDER — PHENYLEPHRINE 80 MCG/ML (10ML) SYRINGE FOR IV PUSH (FOR BLOOD PRESSURE SUPPORT)
PREFILLED_SYRINGE | INTRAVENOUS | Status: DC | PRN
  Administered 2021-09-02: 200 ug via INTRAVENOUS

## 2021-09-02 MED ORDER — BUPIVACAINE-EPINEPHRINE (PF) 0.25% -1:200000 IJ SOLN
INTRAMUSCULAR | Status: AC
  Filled 2021-09-02: qty 30

## 2021-09-02 MED ORDER — ACETAMINOPHEN 650 MG RE SUPP: 650.0000 mg | RECTAL | Status: DC | PRN

## 2021-09-02 MED ORDER — DOCUSATE SODIUM 100 MG PO CAPS
100.0000 mg | ORAL_CAPSULE | Freq: Two times a day (BID) | ORAL | Status: DC
  Administered 2021-09-02 – 2021-09-04 (×4): 100 mg via ORAL
  Filled 2021-09-02 (×4): qty 1

## 2021-09-02 MED ORDER — FENTANYL CITRATE (PF) 250 MCG/5ML IJ SOLN
INTRAMUSCULAR | Status: DC | PRN
  Administered 2021-09-02: 150 ug via INTRAVENOUS
  Administered 2021-09-02: 100 ug via INTRAVENOUS
  Administered 2021-09-02: 50 ug via INTRAVENOUS
  Administered 2021-09-02 (×2): 100 ug via INTRAVENOUS

## 2021-09-02 MED ORDER — ROCURONIUM BROMIDE 10 MG/ML (PF) SYRINGE
PREFILLED_SYRINGE | INTRAVENOUS | Status: DC | PRN
  Administered 2021-09-02: 20 mg via INTRAVENOUS
  Administered 2021-09-02: 80 mg via INTRAVENOUS

## 2021-09-02 MED ORDER — CHLORHEXIDINE GLUCONATE 0.12 % MT SOLN
OROMUCOSAL | Status: AC
  Administered 2021-09-02: 15 mL via OROMUCOSAL
  Filled 2021-09-02: qty 15

## 2021-09-02 MED ORDER — BUPIVACAINE-EPINEPHRINE 0.25% -1:200000 IJ SOLN
INTRAMUSCULAR | Status: DC | PRN
  Administered 2021-09-02: 10 mL

## 2021-09-02 MED ORDER — SUGAMMADEX SODIUM 200 MG/2ML IV SOLN
INTRAVENOUS | Status: DC | PRN
  Administered 2021-09-02: 400 mg via INTRAVENOUS

## 2021-09-02 MED ORDER — SODIUM CHLORIDE 0.9 % IV SOLN
250.0000 mL | INTRAVENOUS | Status: DC
  Administered 2021-09-02: 250 mL via INTRAVENOUS

## 2021-09-02 MED ORDER — PHENOL 1.4 % MT LIQD: 1.0000 | OROMUCOSAL | Status: DC | PRN

## 2021-09-02 MED ORDER — THROMBIN 20000 UNITS EX SOLR
CUTANEOUS | Status: DC | PRN
  Administered 2021-09-02: 20 mL via TOPICAL

## 2021-09-02 MED ORDER — FLEET ENEMA 7-19 GM/118ML RE ENEM: 1.0000 | ENEMA | Freq: Once | RECTAL | Status: DC | PRN

## 2021-09-02 MED ORDER — PHENYLEPHRINE HCL-NACL 20-0.9 MG/250ML-% IV SOLN
INTRAVENOUS | Status: DC | PRN
  Administered 2021-09-02: 25 ug/min via INTRAVENOUS

## 2021-09-02 SURGICAL SUPPLY — 105 items
AGENT HMST KT MTR STRL THRMB (HEMOSTASIS)
APL SKNCLS STERI-STRIP NONHPOA (GAUZE/BANDAGES/DRESSINGS) ×1
BAG COUNTER SPONGE SURGICOUNT (BAG) ×3 IMPLANT
BAG SPNG CNTER NS LX DISP (BAG) ×1
BENZOIN TINCTURE PRP APPL 2/3 (GAUZE/BANDAGES/DRESSINGS) ×3 IMPLANT
BLADE CLIPPER SURG (BLADE) ×1 IMPLANT
BUR PRESCISION 1.7 ELITE (BURR) ×3 IMPLANT
BUR ROUND FLUTED 5 RND (BURR) ×3 IMPLANT
BUR ROUND PRECISION 4.0 (BURR) IMPLANT
BUR SABER RD CUTTING 3.0 (BURR) IMPLANT
CAGE SABLE 10X26 6-12 8D (Cage) ×1 IMPLANT
CANNULA GRAFT BNE VG PRE-FILL (Bone Implant) IMPLANT
CARTRIDGE OIL MAESTRO DRILL (MISCELLANEOUS) ×2 IMPLANT
CNTNR URN SCR LID CUP LEK RST (MISCELLANEOUS) ×2 IMPLANT
CONT SPEC 4OZ STRL OR WHT (MISCELLANEOUS) ×2
COVER BACK TABLE 60X90IN (DRAPES) ×3 IMPLANT
COVER MAYO STAND STRL (DRAPES) ×6 IMPLANT
COVER SURGICAL LIGHT HANDLE (MISCELLANEOUS) ×2 IMPLANT
DIFFUSER DRILL AIR PNEUMATIC (MISCELLANEOUS) ×3 IMPLANT
DISPENSER GRAFT BNE VG (MISCELLANEOUS) IMPLANT
DISPENSER VIVIGEN BONE GRAFT (MISCELLANEOUS) ×2 IMPLANT
DRAIN CHANNEL 15F RND FF W/TCR (WOUND CARE) ×1 IMPLANT
DRAPE C-ARM 35X43 STRL (DRAPES) ×1 IMPLANT
DRAPE C-ARM 42X72 X-RAY (DRAPES) ×3 IMPLANT
DRAPE C-ARMOR (DRAPES) ×1 IMPLANT
DRAPE POUCH INSTRU U-SHP 10X18 (DRAPES) ×2 IMPLANT
DRAPE SURG 17X23 STRL (DRAPES) ×8 IMPLANT
DURAPREP 26ML APPLICATOR (WOUND CARE) ×3 IMPLANT
ELECT BLADE 4.0 EZ CLEAN MEGAD (MISCELLANEOUS) ×2
ELECT CAUTERY BLADE 6.4 (BLADE) ×3 IMPLANT
ELECT REM PT RETURN 9FT ADLT (ELECTROSURGICAL) ×2
ELECTRODE BLDE 4.0 EZ CLN MEGD (MISCELLANEOUS) ×2 IMPLANT
ELECTRODE REM PT RTRN 9FT ADLT (ELECTROSURGICAL) ×2 IMPLANT
EVACUATOR SILICONE 100CC (DRAIN) IMPLANT
FILTER STRAW FLUID ASPIR (MISCELLANEOUS) ×3 IMPLANT
GAUZE 4X4 16PLY ~~LOC~~+RFID DBL (SPONGE) ×2 IMPLANT
GAUZE SPONGE 4X4 12PLY STRL (GAUZE/BANDAGES/DRESSINGS) ×3 IMPLANT
GLOVE BIO SURGEON STRL SZ7 (GLOVE) ×3 IMPLANT
GLOVE BIO SURGEON STRL SZ8 (GLOVE) ×3 IMPLANT
GLOVE BIOGEL PI IND STRL 7.0 (GLOVE) ×2 IMPLANT
GLOVE BIOGEL PI IND STRL 7.5 (GLOVE) IMPLANT
GLOVE BIOGEL PI IND STRL 8 (GLOVE) ×2 IMPLANT
GLOVE BIOGEL PI INDICATOR 7.0 (GLOVE) ×1
GLOVE BIOGEL PI INDICATOR 7.5 (GLOVE) ×2
GLOVE BIOGEL PI INDICATOR 8 (GLOVE) ×1
GLOVE ECLIPSE 7.0 STRL STRAW (GLOVE) ×4 IMPLANT
GLOVE SURG ENC MOIS LTX SZ6.5 (GLOVE) ×3 IMPLANT
GOWN STRL REUS W/ TWL LRG LVL3 (GOWN DISPOSABLE) ×4 IMPLANT
GOWN STRL REUS W/ TWL XL LVL3 (GOWN DISPOSABLE) ×2 IMPLANT
GOWN STRL REUS W/TWL LRG LVL3 (GOWN DISPOSABLE) ×6
GOWN STRL REUS W/TWL XL LVL3 (GOWN DISPOSABLE) ×4
GRAFT BONE CANNULA VIVIGEN 3 (Bone Implant) ×6 IMPLANT
IV CATH 14GX2 1/4 (CATHETERS) ×3 IMPLANT
KIT BASIN OR (CUSTOM PROCEDURE TRAY) ×3 IMPLANT
KIT POSITION SURG JACKSON T1 (MISCELLANEOUS) ×3 IMPLANT
KIT TURNOVER KIT B (KITS) ×3 IMPLANT
MARKER SKIN DUAL TIP RULER LAB (MISCELLANEOUS) ×6 IMPLANT
NDL 18GX1X1/2 (RX/OR ONLY) (NEEDLE) ×2 IMPLANT
NDL HYPO 25GX1X1/2 BEV (NEEDLE) ×2 IMPLANT
NDL SPNL 18GX3.5 QUINCKE PK (NEEDLE) ×4 IMPLANT
NEEDLE 18GX1X1/2 (RX/OR ONLY) (NEEDLE) ×2 IMPLANT
NEEDLE 22X1 1/2 (OR ONLY) (NEEDLE) ×6 IMPLANT
NEEDLE HYPO 25GX1X1/2 BEV (NEEDLE) ×2 IMPLANT
NEEDLE SPNL 18GX3.5 QUINCKE PK (NEEDLE) ×4 IMPLANT
NS IRRIG 1000ML POUR BTL (IV SOLUTION) ×5 IMPLANT
OIL CARTRIDGE MAESTRO DRILL (MISCELLANEOUS) ×2
PACK LAMINECTOMY NEURO (CUSTOM PROCEDURE TRAY) ×1 IMPLANT
PACK LAMINECTOMY ORTHO (CUSTOM PROCEDURE TRAY) ×2 IMPLANT
PACK UNIVERSAL I (CUSTOM PROCEDURE TRAY) ×3 IMPLANT
PAD ARMBOARD 7.5X6 YLW CONV (MISCELLANEOUS) ×6 IMPLANT
PATTIES SURGICAL .5 X1 (DISPOSABLE) ×2 IMPLANT
PATTIES SURGICAL .5X1.5 (GAUZE/BANDAGES/DRESSINGS) ×2 IMPLANT
ROD EXPEDIUM PER BENT 65MM (Rod) ×1 IMPLANT
ROD SPINAL EXP 5.5X60 (Rod) ×1 IMPLANT
SCREW CORTICAL VIPER 7X35 (Screw) ×1 IMPLANT
SCREW SET SINGLE INNER (Screw) ×6 IMPLANT
SCREW VIPER CORT FIX 6X35 (Screw) ×2 IMPLANT
SOL ANTI FOG 6CC (MISCELLANEOUS) IMPLANT
SOLUTION ANTI FOG 6CC (MISCELLANEOUS) ×1
SPONGE INTESTINAL PEANUT (DISPOSABLE) ×3 IMPLANT
SPONGE SURGIFOAM ABS GEL 100 (HEMOSTASIS) ×3 IMPLANT
STRIP CLOSURE SKIN 1/2X4 (GAUZE/BANDAGES/DRESSINGS) ×6 IMPLANT
SURGIFLO W/THROMBIN 8M KIT (HEMOSTASIS) IMPLANT
SUT BONE WAX W31G (SUTURE) ×1 IMPLANT
SUT ETHILON 2 0 FS 18 (SUTURE) ×1 IMPLANT
SUT MNCRL AB 4-0 PS2 18 (SUTURE) ×3 IMPLANT
SUT VIC AB 0 CT1 18XCR BRD 8 (SUTURE) ×2 IMPLANT
SUT VIC AB 0 CT1 8-18 (SUTURE) ×2
SUT VIC AB 1 CT1 18XCR BRD 8 (SUTURE) ×2 IMPLANT
SUT VIC AB 1 CT1 8-18 (SUTURE) ×2
SUT VIC AB 2-0 CT2 18 VCP726D (SUTURE) ×4 IMPLANT
SYR 20ML LL LF (SYRINGE) ×6 IMPLANT
SYR BULB IRRIG 60ML STRL (SYRINGE) ×2 IMPLANT
SYR CONTROL 10ML LL (SYRINGE) ×6 IMPLANT
SYR TB 1ML LUER SLIP (SYRINGE) ×3 IMPLANT
TAP EXPEDIUM DL 4.35 (INSTRUMENTS) ×1 IMPLANT
TAP EXPEDIUM DL 5.0 (INSTRUMENTS) ×1 IMPLANT
TAP EXPEDIUM DL 6.0 (INSTRUMENTS) ×2 IMPLANT
TAP EXPEDIUM DL 7.0 (INSTRUMENTS) ×2
TAP EXPEDIUM DL 7X2 (INSTRUMENTS) IMPLANT
TAPE CLOTH SURG 4X10 WHT LF (GAUZE/BANDAGES/DRESSINGS) ×1 IMPLANT
TAPE STRIPS DRAPE STRL (GAUZE/BANDAGES/DRESSINGS) ×1 IMPLANT
TRAY FOLEY MTR SLVR 16FR STAT (SET/KITS/TRAYS/PACK) ×3 IMPLANT
WATER STERILE IRR 1000ML POUR (IV SOLUTION) ×3 IMPLANT
YANKAUER SUCT BULB TIP NO VENT (SUCTIONS) ×3 IMPLANT

## 2021-09-02 NOTE — Anesthesia Postprocedure Evaluation (Signed)
Anesthesia Post Note  Patient: Katie Combs  Procedure(s) Performed: LEFT-SIDED LUMBAR THREE - LUMBAR FOUR TRANSFORAMINAL LUMBAR INTERBODY FUSION AND DECOMPRESSION WITH INSTRUMENTATION AND ALLOGRAFT (Left)     Patient location during evaluation: PACU Anesthesia Type: General Level of consciousness: sedated Pain management: pain level controlled Vital Signs Assessment: post-procedure vital signs reviewed and stable Respiratory status: spontaneous breathing and respiratory function stable Cardiovascular status: stable Postop Assessment: no apparent nausea or vomiting Anesthetic complications: no   No notable events documented.  Last Vitals:  Vitals:   09/02/21 2015 09/02/21 2041  BP: (!) 155/85 (!) 175/71  Pulse: 97 95  Resp: 17 18  Temp: 36.7 C 36.8 C  SpO2: 100% 100%    Last Pain:  Vitals:   09/02/21 2041  TempSrc: Oral  PainSc:                  Torey Reinard DANIEL

## 2021-09-02 NOTE — Op Note (Signed)
PATIENT NAME: Katie Combs   MEDICAL RECORD NO.:   [id number]    DATE OF BIRTH: Nov 18, 1950   DATE OF PROCEDURE: 09/02/2021                                OPERATIVE REPORT     PREOPERATIVE DIAGNOSES: 1. Left-sided lumbar radiculopathy. 2. 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 3. S/p previous L4/5 fusion 4. Severe segmental collapse on the left at L3/4    POSTOPERATIVE DIAGNOSES: 1. Left-sided lumbar radiculopathy. 2. 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 3. S/p previous L4/5 fusion 4. Severe segmental collapse on the left at L3/4   PROCEDURES: 1. L3-4 decompression. 2. Left-sided L3-4 transforaminal lumbar interbody fusion. 3. Right-sided L3-4 posterolateral fusion. 4. Insertion of interbody device x1 (Globus expandable intervertebral spacer). 5. Placement of posterior instrumentation at L3, L4 bilaterally. 6. Use of local autograft. 7. Use of morselized allograft - ViviGen. 8. Intraoperative use of fluoroscopy. 9.  Exploration of spinal fusion, L4-5   SURGEON:  Estill Bamberg, MD.   ASSISTANTJason Coop, PA-C.   ANESTHESIA:  General endotracheal anesthesia.   COMPLICATIONS:  None.   DISPOSITION:  Stable.   ESTIMATED BLOOD LOSS:   250 cc   INDICATIONS FOR SURGERY:  Briefly, Katie Combs is a pleasant 71 y.o. -year-old female who did present to me with severe pain and weakness in the left leg, which has been present for 6 weeks. This became much more severe to where a hospital admission was required, due to her inability to ambulate without unbearable pain. I did feel that the symptoms were secondary to the findings noted above on her MRI. Katie Combs did wish to proceed with the procedure noted above.   OPERATIVE DETAILS:  On 09/02/2021, the patient was brought to surgery and general endotracheal anesthesia was administered.  The patient was placed prone on a well-padded flat Jackson bed with a spinal frame.  Antibiotics were given and a time-out procedure was performed. The back was prepped and  draped in the usual fashion.  A midline incision was made overlying the L3-4 intervertebral space.  The fascia was incised at the midline.  The paraspinal musculature was bluntly swept laterally.  The previously placed hardware was readily identified.  At this point, the caps overlying the L4 and L5 screws bilaterally were removed, as were the interconnecting rods.  I then explored the fusion mass across L4-5, and I did grab firm hold of the L4 and L5 pedicle screws.  I then performed a pushing and pulling maneuver, in order to explore the fusion, and it was clear that there was a solid fusion across the L4-5 segment.  Anatomic landmarks for the L3 pedicles were then exposed. Using fluoroscopy, I did cannulate the L3 pedicles bilaterally, using a medial to lateral cortical trajectory technique.  On the right side, the posterolateral gutter and right facet joint at L3-4 was decorticated and a 6 x 35 mm screw was placed.  A 65 mm rod was secured to the tulip heads of the right L3, L4, and L5 pedicle screws.  Caps were then placed and were provisionally tightened.  On the left side, the cannulated pedicle hole at L3 was filled with bone wax.  The L4 pedicle screw on the left was removed.  I then proceeded with the decompressive aspect of the procedure.  A laminectomy was performed at L3-4, and a bilateral partial facetectomy was also performed.  The left L3-4 facet  joint was removed.  The traversing left L4 nerve was identified, and gently retracted medially.  In doing so, multiple disc fragments were readily noted immediately ventral to the nerve.  Some fragments were at the level of the intervertebral disc space, and some are migrated inferiorly, behind the L4 vertebral body.  These were all uneventfully removed in their entirety, thoroughly decompressing the L3-4 level, and the traversing left L4 nerve.  Of note, this was a rather meticulous portion of the procedure, as there were disc fragments very much  adherent to the L4 vertebral body, some of which were very medial.  I was however able to remove the disc fragments in their entirety.  With an assistant holding medial retraction of the traversing left L4 nerve, I did perform an annulotomy at the posterolateral aspect of the L3-4 intervertebral space.  I then used a series of curettes and pituitary rongeurs to perform a thorough and complete intervertebral diskectomy.  The intervertebral space was then liberally packed with autograft as well as allograft in the form of ViviGen, as was the appropriate-sized intervertebral spacer.  The expandable spacer was then tamped into position in the usual fashion, after which point it was expanded to approximately 9.3 mm in height.  I was very pleased with the press-fit of the spacer.  I then placed a 6 x 35 mm screw on the left at L3, and the previously cannulated pedicle hole was read tapped, and a 7 x 35 mm screw was advanced into the L4 pedicle on the left.  A 60 mm rod was then secured into the tulip heads of the left at the L3, L4, and L5 pedicle screws. All caps were then locked.  The wound was copiously irrigated with a total of approximately 3 L prior to placing the bone graft.  Additional autograft and allograft were then packed into the posterolateral gutter on the right side to help aid in the L3-4 fusion.  The wound was explored for any undue bleeding and there was minimal bleeding noted.  Due to this, I did elect to place a #15 deep Blake drain.  Gel-Foam was placed over the laminectomy site.  The wound was then closed in layers using #1 Vicryl followed by 2-0 Vicryl, followed by 4-0 Monocryl.  Benzoin and Steri-Strips were applied followed by sterile dressing.    Of note, Jason Coop was my assistant throughout surgery, and did aid in retraction, placement of the hardware, suctioning, and closure.     Estill Bamberg, MD

## 2021-09-03 ENCOUNTER — Other Ambulatory Visit (HOSPITAL_COMMUNITY): Payer: Self-pay

## 2021-09-03 DIAGNOSIS — N179 Acute kidney failure, unspecified: Secondary | ICD-10-CM

## 2021-09-03 DIAGNOSIS — E785 Hyperlipidemia, unspecified: Secondary | ICD-10-CM

## 2021-09-03 DIAGNOSIS — E669 Obesity, unspecified: Secondary | ICD-10-CM

## 2021-09-03 DIAGNOSIS — M5442 Lumbago with sciatica, left side: Secondary | ICD-10-CM | POA: Diagnosis not present

## 2021-09-03 LAB — BASIC METABOLIC PANEL
Anion gap: 10 (ref 5–15)
BUN: 16 mg/dL (ref 8–23)
CO2: 25 mmol/L (ref 22–32)
Calcium: 9 mg/dL (ref 8.9–10.3)
Chloride: 98 mmol/L (ref 98–111)
Creatinine, Ser: 1.17 mg/dL — ABNORMAL HIGH (ref 0.44–1.00)
GFR, Estimated: 51 mL/min — ABNORMAL LOW (ref >60)
Glucose, Bld: 191 mg/dL — ABNORMAL HIGH (ref 70–99)
Potassium: 3.6 mmol/L (ref 3.5–5.1)
Sodium: 133 mmol/L — ABNORMAL LOW (ref 135–145)

## 2021-09-03 LAB — CBC
HCT: 27.7 % — ABNORMAL LOW (ref 36.0–46.0)
Hemoglobin: 9.2 g/dL — ABNORMAL LOW (ref 12.0–15.0)
MCH: 28 pg (ref 26.0–34.0)
MCHC: 33.2 g/dL (ref 30.0–36.0)
MCV: 84.2 fL (ref 80.0–100.0)
Platelets: 225 10*3/uL (ref 150–400)
RBC: 3.29 MIL/uL — ABNORMAL LOW (ref 3.87–5.11)
RDW: 14 % (ref 11.5–15.5)
WBC: 12.1 10*3/uL — ABNORMAL HIGH (ref 4.0–10.5)
nRBC: 0 % (ref 0.0–0.2)

## 2021-09-03 MED ORDER — OXYCODONE HCL ER 10 MG PO T12A
20.0000 mg | EXTENDED_RELEASE_TABLET | Freq: Two times a day (BID) | ORAL | Status: DC
  Administered 2021-09-03 – 2021-09-04 (×3): 20 mg via ORAL
  Filled 2021-09-03 (×3): qty 2

## 2021-09-03 NOTE — Assessment & Plan Note (Signed)
[facility]

## 2021-09-03 NOTE — Assessment & Plan Note (Signed)
Bowel regimen Encouraged miralax at home

## 2021-09-03 NOTE — Assessment & Plan Note (Signed)
a1c 6.9 (in diabetic range June 2021) Most recent A1c is 6.3 Follow outpatient

## 2021-09-03 NOTE — Assessment & Plan Note (Addendum)
MRI 07/28/2021 with diffuse spondylosis (progressed since prior MRI), at L3-4 degenerative changes with inferiorly migrating left central disc extrusion and epidural lipomatosis result in moderate spinal canal stenosis with narrowing of the bilateral subarticular zones, severe on the L, moderate right, and severe left neural narrowing.  At l1-2 narrowing of bilateral subarticular zones, severe right and moderate left neural foraminal narrowing.  At L2-3 narrowing of the bilateral subarticular moderate right and mild left neural foraminal.  At L4-5, T1 hypointense tissue in the L neural foramen may represent post surgical fibrosis. S/p L3-4 decompression, L sided L3-4 transforaminal lumbar interbody fusion, right sided L3-4 posterolateral fusion, insersion of interbody device x1, placement of posterior instrumentation at L3, L4 bilaterally, use of local autograft, use of morselized allograft, intraoperative use of fluoroscopy, exploration of spinal fusion L4-5 Pain management per orthopedics, PT/OT Per orthopedic surgery

## 2021-09-03 NOTE — Progress Notes (Signed)
    Patient doing well  Patient reports improved left leg pain Has been ambulating   Physical Exam: Vitals:   09/03/21 0044 09/03/21 0503  BP: 121/74 134/68  Pulse: 87 92  Resp: 18 18  Temp: 97.9 F (36.6 C) 97.9 F (36.6 C)  SpO2: 98% 98%    Dressing in place NVI  Drain output: 110cc since surgery.  Unfortunately, the nurse taking care of him this morning remove the drain.  There was no order for this to be done.  POD #1 s/p L3/4 decompression and fusion  - up with PT/OT, encourage ambulation - Percocet for pain, Robaxin for muscle spasms.  I did also add 20 mg of OxyContin to take twice per day. - Likely d/c home tomorrow with f/u in 2 weeks

## 2021-09-03 NOTE — Hospital Course (Signed)
Katie Combs is Katie Combs pleasant 71 y.o. female with medical history significant for diet-controlled diabetes mellitus, hypertension, anxiety, pudendal neuralgia, and chronic low back pain who presents to the emergency department with severe worsening in her chronic low back pain.  Patient describes chronic low back pain with radiation down the left leg for which she follows with [facility] Scales spine surgeon and underwent today transforaminal lumbar interbody fusion in 2020.  She has been experiencing increased pain from her low back and left hip with radiation down to her calf.  She has been unable to sit or ambulate without Katie Combs walker for the past week due to this.  She describes some chronic left leg weakness and feels that that has worsened slightly but she attributes her new mobility problems primarily to pain.  She denies any fevers or chills, change in bowel or bladder control, or new saddle anesthesia.   ED Course: Upon arrival to the ED, patient is found to be afebrile and saturating well on room air with stable blood pressure.  Plain films of the lumbar spine are negative for acute findings.  ED discussed the case with Dr. Yevette Edwards of orthopedic surgery who recommended admission for pain control and indicated that the may be able to expedite her surgery that had been planned to take place in 2 weeks.  She's now s/p surgery with orthopedics.  They've now taken over.  Hospitalist consulting at this time.  Discharge recs Lisinopril and triamterene/hctz on hold.  Started on hydralazine.  Discuss with PCP outpatient to determine whether to resume this outpatient based on BP and renal function outpatient  Follow renal function outpatient.  Limit NSAIDs. Follow diabetes outpatient  Follow anemia outpatient

## 2021-09-03 NOTE — Evaluation (Signed)
Occupational Therapy RE-Evaluation Patient Details Name: Katie Combs MRN: [id number] DOB: May 13, 1950 Today's Date: 09/03/2021   History of Present Illness 71  y.o. F who presents with increased pain and weakness in LLE and difficulty walking. s/p 6/27 L3-4 TLIF and decompression. Significant PMH: depression, DM2, pendulum neuralgia, back surgery.   Clinical Impression   Patient is s/p L3-4 TLIF with decompression surgery resulting in functional limitations due to the deficits listed below (see OT problem list). Pt currently reports needing increased pain medications and RN made aware. Pt able to complete bed mobility and toilet transfer this session. Pt motivated and cued for back precautions due to urgency to void bladder with bathroom transfer. Spouse present for all education.  Patient will benefit from skilled OT acutely to increase independence and safety with ADLS to allow discharge HHOT. Pt educated on HHOT recommendation and agreeable. Pt does mention previous outpatient services. Pt motivated by x3 cats at home.        Recommendations for follow up therapy are one component of a multi-disciplinary discharge planning process, led by the attending physician.  Recommendations may be updated based on patient status, additional functional criteria and insurance authorization.   Follow Up Recommendations  Home health OT    Assistance Recommended at Discharge PRN  Patient can return home with the following A little help with walking and/or transfers;A little help with bathing/dressing/bathroom;Assistance with cooking/housework;Assist for transportation    Functional Status Assessment  Patient has had a recent decline in their functional status and demonstrates the ability to make significant improvements in function in a reasonable and predictable amount of time.  Equipment Recommendations  None recommended by OT    Recommendations for Other Services       Precautions /  Restrictions Precautions Precautions: Fall Precaution Comments: reviewed back precautions for adls Restrictions Weight Bearing Restrictions: No      Mobility Bed Mobility Overal bed mobility: Needs Assistance Bed Mobility: Supine to Sit Rolling: Min guard   Supine to sit: Min guard     General bed mobility comments: bed rail removed HOB 25 degrees and instructed on log rolling to help progress to EOB sitting. pt educated on simulation of home setup    Transfers Overall transfer level: Needs assistance Equipment used: Rolling walker (2 wheels) Transfers: Sit to/from Stand Sit to Stand: Min guard           General transfer comment: pt pushing from bed surface with 1 UE and able to power up from toilet and bed surface that simulates home surface      Balance Overall balance assessment: Needs assistance Sitting-balance support: Bilateral upper extremity supported, Feet supported Sitting balance-Leahy Scale: Fair     Standing balance support: Bilateral upper extremity supported, During functional activity, Reliant on assistive device for balance Standing balance-Leahy Scale: Poor                             ADL either performed or assessed with clinical judgement   ADL Overall ADL's : Needs assistance/impaired Eating/Feeding: Independent   Grooming: Wash/dry face;Wash/dry hands;Min guard;Standing Grooming Details (indicate cue type and reason): sink level with education on sink surface for adl items Upper Body Bathing: Sitting;Set up   Lower Body Bathing: Maximal assistance;Sit to/from stand Lower Body Bathing Details (indicate cue type and reason): reports plan to purchase reacher Upper Body Dressing : Set up;Sitting   Lower Body Dressing: Maximal assistance;Sit to/from stand   Toilet  Transfer: Min guard;Ambulation;Regular Toilet;Grab bars   Toileting- Clothing Manipulation and Hygiene: Supervision/safety;Sit to/from stand;Adhering to back  precautions Toileting - Clothing Manipulation Details (indicate cue type and reason): pt twisting with L UE to pull at toilet paper holder     Functional mobility during ADLs: Supervision/safety;Rolling walker (2 wheels) General ADL Comments: pt using home slip on shoes during session as pt does not like hospital socks  Back handout provided and reviewed adls in detail. Pt educated on: clothing between brace, never sleep in brace, set an alarm at night for medication, avoid sitting for long periods of time, correct bed positioning for sleeping, correct sequence for bed mobility, avoiding lifting more than 5 pounds and never wash directly over incision. Educated on use of automotive funnel to help provide water and food to cats from seated position. Spouse will assist with cats for now.     Vision Baseline Vision/History: 0 No visual deficits Patient Visual Report: No change from baseline       Perception     Praxis      Pertinent Vitals/Pain Pain Assessment Pain Assessment: Faces Faces Pain Scale: Hurts whole lot Pain Location: back Pain Descriptors / Indicators: Discomfort, Grimacing, Operative site guarding Pain Intervention(s): Repositioned, Monitored during session, Patient requesting pain meds-RN notified (RN made aware of request and medication pending)     Hand Dominance Right   Extremity/Trunk Assessment Upper Extremity Assessment Upper Extremity Assessment: Overall WFL for tasks assessed   Lower Extremity Assessment Lower Extremity Assessment: Defer to PT evaluation   Cervical / Trunk Assessment Cervical / Trunk Assessment: Back Surgery   Communication Communication Communication: No difficulties   Cognition Arousal/Alertness: Awake/alert Behavior During Therapy: WFL for tasks assessed/performed Overall Cognitive Status: Within Functional Limits for tasks assessed                                       General Comments  dressing intact and dry  at this time. educated on back brace positioning    Exercises Exercises: Other exercises Other Exercises Other Exercises: pt attempting to don back brace over the shoulders. Pt educated on brace under the shoulders and TLSO vista aspen photo from Merchant navy officer in the room shown to patient as visual aide. Other Exercises: back brace with straps detached and throacic portion detached. The brace was reattached with the straps at the lateral horizontal slots of the brace on the waist band portion.   Shoulder Instructions      Home Living Family/patient expects to be discharged to:: Private residence Living Arrangements: Spouse/significant other Available Help at Discharge: Family Type of Home: House Home Access: Stairs to enter Secretary/administrator of Steps: 1 Entrance Stairs-Rails: None Home Layout: One level     Bathroom Shower/Tub: Producer, television/film/video: Standard     Home Equipment: Agricultural consultant (2 wheels);Shower seat - built in;Grab bars - tub/shower;Hand held shower head;Grab bars - toilet;Adaptive equipment;Toilet riser;BSC/3in1 Lexicographer)   Additional Comments: 3 cats - Magnus Ivan., Mr Murlean Hark and Nadeen Landau      Prior Functioning/Environment Prior Level of Function : Independent/Modified Independent             Mobility Comments: requires RW with some help from spouse ADLs Comments: indep        OT Problem List: Decreased activity tolerance;Impaired balance (sitting and/or standing);Pain;Decreased knowledge of use of DME or AE;Decreased safety awareness;Decreased knowledge of precautions;Obesity  OT Treatment/Interventions: Self-care/ADL training;Therapeutic exercise;Energy conservation;DME and/or AE instruction;Therapeutic activities;Patient/family education;Manual therapy;Modalities;Balance training    OT Goals(Current goals can be found in the care plan section) Acute Rehab OT Goals Patient Stated Goal: to get more pain medication OT Goal  Formulation: With patient Time For Goal Achievement: 09/17/21 Potential to Achieve Goals: Good  OT Frequency: Min 2X/week    Co-evaluation              AM-PAC OT "6 Clicks" Daily Activity     Outcome Measure Help from another person eating meals?: None Help from another person taking care of personal grooming?: A Little Help from another person toileting, which includes using toliet, bedpan, or urinal?: A Lot Help from another person bathing (including washing, rinsing, drying)?: A Lot Help from another person to put on and taking off regular upper body clothing?: A Little Help from another person to put on and taking off regular lower body clothing?: A Lot 6 Click Score: 16   End of Session Equipment Utilized During Treatment: Rolling walker (2 wheels);Back brace Nurse Communication: Mobility status;Precautions  Activity Tolerance: Patient tolerated treatment well Patient left: in bed (sitting at the EOB with PT entering the room for ambulation / education. Pt starting PT session)  OT Visit Diagnosis: Unsteadiness on feet (R26.81)                Time: 0626-9485 OT Time Calculation (min): 30 min Charges:  OT General Charges $OT Visit: 1 Visit OT Evaluation $OT Re-eval: 1 Re-eval OT Treatments $Self Care/Home Management : 8-22 mins   Brynn, OTR/L  Acute Rehabilitation Services Office: [phone] .   Mateo Flow 09/03/2021, 11:32 AM

## 2021-09-03 NOTE — Assessment & Plan Note (Signed)
Improved with adjustment of BP meds, holding nsaids, IVF Recommend limiting NSAIDs after discharge Lisinopril/HCTZ currently on hold, can follow with PCP outpatient and discuss potential resumption based on BP and renal function outpatient

## 2021-09-03 NOTE — Progress Notes (Signed)
Physical Therapy Progress Note  Assessment: Pt progressing well with post-op mobility. She was able to demonstrate transfers and ambulation with gross min guard assist to min assist with RW for support. Pt limited by pain but overall with a good rehab effort. 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 Brace adjusted for optimal fit. Pt was educated on precautions, brace application/wearing schedule, appropriate activity progression, and car transfer. Will continue to follow.     09/03/21 1028  PT Visit Information  Last PT Received On 09/03/21  Assistance Needed +1  History of Present Illness Pt is a 71  y.o. F who presents with increased pain and weakness in LLE and difficulty walking. She is now s/p L3-4 TLIF and decompression on 09/02/2021. PMH significant for depression, DM2, pendulum neuralgia, back surgery.  Subjective Data  Subjective Tearful due to pain  Patient Stated Goal to get rid of pain  Precautions  Precautions Fall  Precaution Comments Pt reports she cannot sit due to pudendal neuralgia. Reviewed precautions during functional mobility.  Restrictions  Weight Bearing Restrictions No  Pain Assessment  Pain Assessment Faces  Faces Pain Scale 8  Pain Location L hip/low back  Pain Descriptors / Indicators Grimacing;Guarding;Sore;Crying  Pain Intervention(s) Limited activity within patient's tolerance;Monitored during session;Repositioned  Cognition  Arousal/Alertness Awake/alert  Behavior During Therapy WFL for tasks assessed/performed  Overall Cognitive Status Within Functional Limits for tasks assessed  Bed Mobility  Overal bed mobility Needs Assistance  Bed Mobility Sit to Sidelying  Sit to sidelying Min guard  General bed mobility comments Increased time but able to elevate LE's up into bed at end of session without assist.  Transfers  Overall transfer level Needs assistance  Equipment used Rolling walker (2 wheels)  Transfers Sit to/from Stand  Sit  to Stand Min assist  General transfer comment Min assist for boost into standing due to increased pain.  Ambulation/Gait  Ambulation/Gait assistance Min guard  Gait Distance (Feet) 75 Feet  Assistive device Rolling walker (2 wheels)  Gait Pattern/deviations Step-through pattern;Decreased stride length;Trunk flexed;Shuffle  General Gait Details Slow and guarded due to pain. Pt reports her legs feel shaky but did not note any knee buckling. Distance limited due to fatigue and pain.  Gait velocity decreased  Gait velocity interpretation <1.31 ft/sec, indicative of household ambulator  Balance  Overall balance assessment Needs assistance  Sitting-balance support Bilateral upper extremity supported;Feet supported  Sitting balance-Leahy Scale Fair  Sitting balance - Comments mild LOB with doffing gown  Standing balance support Bilateral upper extremity supported;During functional activity  Standing balance-Leahy Scale Poor  Standing balance comment heavy reliance on RW  PT - End of Session  Equipment Utilized During Treatment Gait belt  Activity Tolerance Patient limited by pain  Patient left in bed;with call bell/phone within reach;with family/visitor present  Nurse Communication Mobility status   PT - Assessment/Plan  PT Plan Current plan remains appropriate  PT Visit Diagnosis Other abnormalities of gait and mobility (R26.89);Difficulty in walking, not elsewhere classified (R26.2);Pain  Pain - Right/Left Left  Pain - part of body Hip  PT Frequency (ACUTE ONLY) Min 5X/week  Follow Up Recommendations Home health PT  Assistance recommended at discharge Frequent or constant Supervision/Assistance  Patient can return home with the following A little help with walking and/or transfers;A lot of help with bathing/dressing/bathroom;Assistance with cooking/housework;Assist for transportation;Help with stairs or ramp for entrance  PT equipment None recommended by PT  AM-PAC PT "6 Clicks" Mobility  Outcome Measure (Version 2)  Help needed turning from your back to your side while in a flat bed without using bedrails? 3  Help needed moving from lying on your back to sitting on the side of a flat bed without using bedrails? 3  Help needed moving to and from a bed to a chair (including a wheelchair)? 3  Help needed standing up from a chair using your arms (e.g., wheelchair or bedside chair)? 3  Help needed to walk in hospital room? 3  Help needed climbing 3-5 steps with a railing?  3  6 Click Score 18  Consider Recommendation of Discharge To: Home with [facility]  Progressive Mobility  What is the highest level of mobility based on the progressive mobility assessment? Level 5 (Walks with assist in room/hall) - Balance while stepping forward/back and can walk in room with assist - Complete  Activity Ambulated with assistance in hallway  PT Goal Progression  Progress towards PT goals Progressing toward goals  Acute Rehab PT Goals  PT Goal Formulation With patient  Time For Goal Achievement 09/15/21  Potential to Achieve Goals Good  PT Time Calculation  PT Start Time (ACUTE ONLY) 0936  PT Stop Time (ACUTE ONLY) 0955  PT Time Calculation (min) (ACUTE ONLY) 19 min  PT General Charges  $$ ACUTE PT VISIT 1 Visit  PT Treatments  $Gait Training 8-22 mins   Conni Slipper, PT, DPT Acute Rehabilitation Services Secure Chat Preferred Office: [phone]

## 2021-09-03 NOTE — Assessment & Plan Note (Signed)
bystolic Lisinopril and HCTZ discontinued -> she's been started on hydralazine 25 mg BID

## 2021-09-03 NOTE — Assessment & Plan Note (Signed)
mild

## 2021-09-03 NOTE — Progress Notes (Signed)
Consult NOTE    Katie Combs  [id number] DOB: 03-01-1951 DOA: 08/29/2021 PCP: System, Provider Not In  Chief Complaint  Patient presents with   Back Pain    Brief Narrative:  Katie Combs is Katie Combs pleasant 71 y.o. female with medical history significant for diet-controlled diabetes mellitus, hypertension, anxiety, pudendal neuralgia, and chronic low back pain who presents to the emergency department with severe worsening in her chronic low back pain.  Patient describes chronic low back pain with radiation down the left leg for which she follows with Katie Combs spine surgeon and underwent today transforaminal lumbar interbody fusion in 2020.  She has been experiencing increased pain from her low back and left hip with radiation down to her calf.  She has been unable to sit or ambulate without Katie Combs walker for the past week due to this.  She describes some chronic left leg weakness and feels that that has worsened slightly but she attributes her new mobility problems primarily to pain.  She denies any fevers or chills, change in bowel or bladder control, or new saddle anesthesia.   ED Course: Upon arrival to the ED, patient is found to be afebrile and saturating well on room air with stable blood pressure.  Plain films of the lumbar spine are negative for acute findings.  ED discussed the case with Katie Combs of orthopedic surgery who recommended admission for pain control and indicated that the may be able to expedite her surgery that had been planned to take place in 2 weeks.  She's now s/p surgery with orthopedics.  They've now taken over.  Hospitalist consulting at this time.    Assessment & Plan:   Principal Problem:   Acute left-sided low back pain with sciatica Active Problems:   Constipation   Type 2 diabetes mellitus (HCC)   Dyslipidemia   Hypertension   Stage 3a chronic kidney disease (CKD) (HCC)   AKI (acute kidney injury) ([place])   Anxiety   Hyponatremia   Assessment and Plan: *  Acute left-sided low back pain with sciatica MRI 07/28/2021 with diffuse spondylosis (progressed since prior MRI), at L3-4 degenerative changes with inferiorly migrating left central disc extrusion and epidural lipomatosis result in moderate spinal canal stenosis with narrowing of the bilateral subarticular zones, severe on the L, moderate right, and severe left neural narrowing.  At l1-2 narrowing of bilateral subarticular zones, severe right and moderate left neural foraminal narrowing.  At L2-3 narrowing of the bilateral subarticular moderate right and mild left neural foraminal.  At L4-5, T1 hypointense tissue in the L neural foramen may represent post surgical fibrosis. S/p L3-4 decompression, L sided L3-4 transforaminal lumbar interbody fusion, right sided L3-4 posterolateral fusion, insersion of interbody device x1, placement of posterior instrumentation at L3, L4 bilaterally, use of local autograft, use of morselized allograft, intraoperative use of fluoroscopy, exploration of spinal fusion L4-5 Pain management per orthopedics, PT/OT Per orthopedic surgery  Constipation Bowel regimen  Type 2 diabetes mellitus (HCC) a1c 6.9 (in diabetic range June 2021) Most recent A1c is 6.3 Follow outpatient  Dyslipidemia crestory  Hypertension bystolic Lisinopril and HCTZ discontinued -> she's been started on hydralazine 25 mg BID  AKI (acute kidney injury) ([place]) Improved with adjustment of BP meds, holding nsaids, IVF Recommend limiting NSAIDs after discharge Lisinopril/HCTZ currently on hold, can follow with PCP outpatient and discuss potential resumption based on BP and renal function outpatient  Anxiety Cymbalta, klonopin  Hyponatremia mild      DVT prophylaxis: SCD Code  Status: full Family Communication: husband at bedside Disposition:   Status is: Inpatient Remains inpatient appropriate because: discharge per orthopedics   Consultants:  orthopedics  Procedures:  S/p L3-4  decompression, L sided L3-4 transforaminal lumbar interbody fusion, right sided L3-4 posterolateral fusion, insersion of interbody device x1, placement of posterior instrumentation at L3, L4 bilaterally, use of local autograft, use of morselized allograft, intraoperative use of fluoroscopy, exploration of spinal fusion L4-5  Antimicrobials:  Anti-infectives (From admission, onward)    Start     Dose/Rate Route Frequency Ordered Stop   09/03/21 0600  ceFAZolin (ANCEF) IVPB 2g/100 mL premix        2 g 200 mL/hr over 30 Minutes Intravenous On call to O.R. 09/02/21 1207 09/02/21 1830   09/03/21 0230  ceFAZolin (ANCEF) IVPB 2g/100 mL premix        2 g 200 mL/hr over 30 Minutes Intravenous Every 8 hours 09/02/21 2024 09/03/21 1155   09/02/21 1211  ceFAZolin (ANCEF) 2-4 GM/100ML-% IVPB       Note to Pharmacy: Katie Combs: cabinet override      09/02/21 1211 09/03/21 0014       Subjective: No new complaints  Objective: Vitals:   09/03/21 0503 09/03/21 0729 09/03/21 1118 09/03/21 1601  BP: 134/68 (!) 123/52 130/60 (!) 144/53  Pulse: 92 91 91 97  Resp: 18 18 18 18   Temp: 97.9 F (36.6 C) 98.8 F (37.1 C) 98.6 F (37 C) 97.9 F (36.6 C)  TempSrc: Oral Oral Oral   SpO2: 98% 100% 100% 99%  Weight:      Height:        Intake/Output Summary (Last 24 hours) at 09/03/2021 1752 Last data filed at 09/03/2021 1310 Gross per 24 hour  Intake 2339.1 ml  Output 160 ml  Net 2179.1 ml   Filed Weights   08/29/21 1239 09/02/21 1210  Weight: 90.3 kg 90.3 kg    Examination:  General exam: Appears calm and comfortable  Respiratory system: unlabored Cardiovascular system: RRR Gastrointestinal system: Abdomen is nondistended, soft and nontender. Central nervous system: Alert and oriented. No focal neurological deficits. Extremities: no LEE  Data Reviewed: I have personally reviewed following labs and imaging studies  CBC: Recent Labs  Lab 08/29/21 1819 08/30/21 0450  08/31/21 0208 09/01/21 0040 09/02/21 0231 09/03/21 0901  WBC 10.6* 11.8* 11.3* 8.1 8.7 12.1*  NEUTROABS 8.3*  --   --   --   --   --   HGB 16.3* 15.0 13.5 11.9* 11.9* 9.2*  HCT 48.2* 45.7 42.0 36.8 35.7* 27.7*  MCV 83.1 83.2 85.2 84.6 83.8 84.2  PLT 371 363 308 268 264 225    Basic Metabolic Panel: Recent Labs  Lab 08/30/21 0450 08/31/21 0208 09/01/21 0040 09/02/21 0231 09/03/21 0901  NA 131* 130* 129* 138 133*  K 4.0 4.4 3.6 3.8 3.6  CL 95* 95* 99 100 98  CO2 26 24 25 25 25   GLUCOSE 112* 117* 105* 97 191*  BUN 27* 35* 28* 18 16  CREATININE 1.43* 1.52* 1.14* 1.09* 1.17*  CALCIUM 10.2 9.6 8.9 9.7 9.0  PHOS  --   --  3.2  --   --     GFR: Estimated Creatinine Clearance: 49.9 mL/min (Katie Combs) (by C-G formula based on SCr of 1.17 mg/dL (H)).  Liver Function Tests: Recent Labs  Lab 09/01/21 0040  ALBUMIN 3.2*    CBG: Recent Labs  Lab 08/30/21 1838 08/30/21 2138 08/31/21 0908 08/31/21 1145 09/02/21 1918  GLUCAP  177* 142* 102* 137* 144*     Recent Results (from the past 240 hour(s))  Urine Culture     Status: Abnormal   Collection Time: 08/31/21  9:01 AM   Specimen: Urine, Catheterized  Result Value Ref Range Status   Specimen Description URINE, CATHETERIZED  Final   Special Requests NONE  Final   Culture (Katie Combs)  Final    <10,000 COLONIES/mL INSIGNIFICANT GROWTH Performed at [facility], [place] [address], [place], [place] [address]    Report Status 09/02/2021 FINAL  Final  Surgical pcr screen     Status: Abnormal   Collection Time: 09/01/21 11:18 PM   Specimen: Nasal Mucosa; Nasal Swab  Result Value Ref Range Status   MRSA, PCR NEGATIVE NEGATIVE Final   Staphylococcus aureus POSITIVE (Katie Combs) NEGATIVE Final    Comment: (NOTE) The Xpert SA Assay (FDA approved for NASAL specimens in patients 53 years of age and older), is one component of Katie Combs comprehensive surveillance program. It is not intended to diagnose infection nor to guide or monitor  treatment. Performed at [facility], [place] [address] [place], [place] [address]          Radiology Studies: DG Lumbar Spine 2-3 Views  Result Date: 09/02/2021 CLINICAL DATA:  L3-4 fusion EXAM: LUMBAR SPINE - 2-3 VIEW COMPARISON:  08/29/2021 FINDINGS: Intraoperative imaging demonstrates extension of the posterior fusion changes to include L3-4. No hardware complicating feature. IMPRESSION: L3-4 fusion.  No visible complicating feature. Electronically Signed   By: Rolm Baptise Combs.D.   On: 09/02/2021 19:50   DG C-Arm 1-60 Min-No Report  Result Date: 09/02/2021 Fluoroscopy was utilized by the requesting physician.  No radiographic interpretation.   DG C-Arm 1-60 Min-No Report  Result Date: 09/02/2021 Fluoroscopy was utilized by the requesting physician.  No radiographic interpretation.   DG C-Arm 1-60 Min-No Report  Result Date: 09/02/2021 Fluoroscopy was utilized by the requesting physician.  No radiographic interpretation.   DG C-Arm 1-60 Min-No Report  Result Date: 09/02/2021 Fluoroscopy was utilized by the requesting physician.  No radiographic interpretation.   DG C-Arm 1-60 Min-No Report  Result Date: 09/02/2021 Fluoroscopy was utilized by the requesting physician.  No radiographic interpretation.   DG Lumbar Spine 1 View  Result Date: 09/02/2021 CLINICAL DATA:  Localization for LEFT L3-L4 TLIF EXAM: LUMBAR SPINE - 1 VIEW COMPARISON:  Portable exam 1428 hours compared to 08/29/2021 FINDINGS: Five lumbar vertebra on prior exam. Prior posterior fusion L4-L5 with intervening disc prosthesis. 2 metallic probes via dorsal approach project dorsal to the spinous processes of L2 and L3. IMPRESSION: Dorsal localization of the spinous processes of L2 and L3. Electronically Signed   By: Lavonia Dana Combs.D.   On: 09/02/2021 17:10        Scheduled Meds:  clonazePAM  1 mg Oral QHS   docusate sodium  100 mg Oral BID   DULoxetine  30 mg Oral Daily   gabapentin  800 mg Oral QHS    hydrALAZINE  25 mg Oral BID   loratadine  10 mg Oral Daily   multivitamin with minerals  1 tablet Oral Daily   nebivolol  5 mg Oral QHS   oxyCODONE  20 mg Oral Q12H   rosuvastatin  2.5 mg Oral QHS   senna-docusate  1 tablet Oral QHS   sodium chloride flush  3 mL Intravenous Q12H   tiZANidine  2 mg Oral QHS   Continuous Infusions:  sodium chloride 20 mL/hr at 09/03/21 [id number]  0.9 % NaCl with KCl 20 mEq / L       LOS: 5 days    Time spent: over 30 min    Lacretia Nicks, MD [facility]   To contact the attending provider between 7A-7P or the covering provider during after hours 7P-7A, please log into the web site [email or web address] and access using universal [place] password for that web site. If you do not have the password, please call the hospital operator.  09/03/2021, 5:52 PM

## 2021-09-03 NOTE — Assessment & Plan Note (Signed)
Cymbalta, klonopin

## 2021-09-03 NOTE — Progress Notes (Signed)
Physical Therapy Treatment Patient Details Name: Katie Combs MRN: [id number] DOB: Jan 24, 1951 Today's Date: 09/03/2021   History of Present Illness 71  y.o. F who presents with increased pain and weakness in LLE and difficulty walking. s/p 6/27 L3-4 TLIF and decompression. Significant PMH: depression, DM2, pendulum neuralgia, back surgery.    PT Comments    Pt is progressing well towards goals of PT. Pt's husband was in the room during the session, and pt was agreeable to move saying she walked around 1:30p this afternoon. Pt presented with greater ease of movement with bed mobility, transfers, and ambulation. Pt tolerated 200 ft of ambulation with RW. Pt presents minG for bed mobility, transfers, and ambulation for safety. Pt also presents with mild balance deficits, decreased strength, and decreased tolerance to activity. For these reasons, pt would benefit from skilled PT in order to increase independence for safe d/c home. Will continue to follow acutely.    Recommendations for follow up therapy are one component of a multi-disciplinary discharge planning process, led by the attending physician.  Recommendations may be updated based on patient status, additional functional criteria and insurance authorization.  Follow Up Recommendations  Home health PT     Assistance Recommended at Discharge Frequent or constant Supervision/Assistance  Patient can return home with the following A little help with walking and/or transfers;A lot of help with bathing/dressing/bathroom;Assistance with cooking/housework;Assist for transportation;Help with stairs or ramp for entrance   Equipment Recommendations  None recommended by PT    Recommendations for Other Services       Precautions / Restrictions Precautions Precautions: Fall;Back Restrictions Weight Bearing Restrictions: No     Mobility  Bed Mobility Overal bed mobility: Needs Assistance Bed Mobility: Rolling, Sidelying to Sit, Sit to  Sidelying Rolling: Min guard Sidelying to sit: Min guard     Sit to sidelying: Min assist General bed mobility comments: pt instructed on log rolling technique, ease of movement improved since this morning. min A for sit to sidelying with maneuvering of BLE.    Transfers Overall transfer level: Needs assistance Equipment used: Rolling walker (2 wheels) Transfers: Sit to/from Stand Sit to Stand: Min guard           General transfer comment: from EOB x1 and toilet x1. pt using momentum to power up to stand from EOB, use of grab bar next to toilet to stand.    Ambulation/Gait Ambulation/Gait assistance: Min guard Gait Distance (Feet): 200 Feet Assistive device: Rolling walker (2 wheels) Gait Pattern/deviations: Step-through pattern, Decreased stride length, Trunk flexed, Shuffle Gait velocity: decreased Gait velocity interpretation: <1.31 ft/sec, indicative of household ambulator   General Gait Details: pt with slow and guarded gait pattern due to pain. increased trunk flexion with fatigue and heavy UE WB through walker. pt reports her hands feel shaky and "don't want my legs to give out from under me," but did not note any knee buckling. Pt with 2 standing rest breaks during ambulation. Distance limited due to fatigue and pain.   Stairs             Wheelchair Mobility    Modified Rankin (Stroke Patients Only)       Balance Overall balance assessment: Needs assistance Sitting-balance support: Bilateral upper extremity supported, Feet supported Sitting balance-Leahy Scale: Fair     Standing balance support: Bilateral upper extremity supported, During functional activity, Reliant on assistive device for balance Standing balance-Leahy Scale: Poor Standing balance comment: heavy reliance on RW  Cognition Arousal/Alertness: Awake/alert Behavior During Therapy: WFL for tasks assessed/performed Overall Cognitive Status: Within  Functional Limits for tasks assessed                                          Exercises      General Comments        Pertinent Vitals/Pain Pain Assessment Pain Assessment: Faces Faces Pain Scale: Hurts whole lot    Home Living                          Prior Function            PT Goals (current goals can now be found in the care plan section) Acute Rehab PT Goals Patient Stated Goal: to get rid of pain PT Goal Formulation: With patient Time For Goal Achievement: 09/15/21 Potential to Achieve Goals: Good Progress towards PT goals: Progressing toward goals    Frequency    Min 5X/week      PT Plan Current plan remains appropriate    Co-evaluation              AM-PAC PT "6 Clicks" Mobility   Outcome Measure  Help needed turning from your back to your side while in a flat bed without using bedrails?: A Little Help needed moving from lying on your back to sitting on the side of a flat bed without using bedrails?: A Little Help needed moving to and from a bed to a chair (including a wheelchair)?: A Little Help needed standing up from a chair using your arms (e.g., wheelchair or bedside chair)?: A Little Help needed to walk in hospital room?: A Little Help needed climbing 3-5 steps with a railing? : A Little 6 Click Score: 18    End of Session Equipment Utilized During Treatment: Gait belt;Back brace Activity Tolerance: Patient limited by pain;Patient limited by fatigue Patient left: in bed;with call bell/phone within reach;with family/visitor present Nurse Communication: Mobility status PT Visit Diagnosis: Other abnormalities of gait and mobility (R26.89);Difficulty in walking, not elsewhere classified (R26.2);Pain     Time: 1500-1520 PT Time Calculation (min) (ACUTE ONLY): 20 min  Charges:  $Gait Training: 8-22 mins                    Melvyn Novas, MS, [place] Acute Rehabilitation Services Office: [phone]   Melvyn Novas 09/03/2021, 3:39 PM

## 2021-09-04 ENCOUNTER — Other Ambulatory Visit (HOSPITAL_COMMUNITY): Payer: Medicare PPO

## 2021-09-04 ENCOUNTER — Other Ambulatory Visit (HOSPITAL_COMMUNITY): Payer: Self-pay

## 2021-09-04 ENCOUNTER — Encounter (HOSPITAL_COMMUNITY): Payer: Self-pay | Admitting: Orthopedic Surgery

## 2021-09-04 DIAGNOSIS — M5442 Lumbago with sciatica, left side: Secondary | ICD-10-CM | POA: Diagnosis not present

## 2021-09-04 DIAGNOSIS — D649 Anemia, unspecified: Secondary | ICD-10-CM

## 2021-09-04 MED ORDER — OXYCODONE HCL ER 10 MG PO T12A
20.0000 mg | EXTENDED_RELEASE_TABLET | Freq: Two times a day (BID) | ORAL | 0 refills | Status: AC
  Filled 2021-09-04: qty 56, 14d supply, fill #0

## 2021-09-04 MED ORDER — OXYCODONE-ACETAMINOPHEN 5-325 MG PO TABS
1.0000 | ORAL_TABLET | ORAL | 0 refills | Status: AC | PRN
  Filled 2021-09-04: qty 30, 5d supply, fill #0

## 2021-09-04 MED ORDER — METHOCARBAMOL 750 MG PO TABS
750.0000 mg | ORAL_TABLET | Freq: Four times a day (QID) | ORAL | 2 refills | Status: AC | PRN
  Filled 2021-09-04: qty 30, 8d supply, fill #0

## 2021-09-04 NOTE — TOC Benefit Eligibility Note (Signed)
Patient Advocate Encounter   Received notification that prior authorization for OxyCONTIN 10MG  er tablets is required.   PA submitted on 09/04/2021 Key [id number] Status is pending       09/06/2021, CPhT Pharmacy Patient Advocate Specialist [facility] Patient Advocate Team Direct Number: [phone]  Fax: [phone]

## 2021-09-04 NOTE — TOC Benefit Eligibility Note (Signed)
[facility] Patient Advocate Encounter  Received notification that the request for prior authorization for OxyCONTIN 10MG  er tablets has been denied due to must try Xtampza ER first.       , CPhT Pharmacy Patient Advocate Specialist [facility] Patient Advocate Team Direct Number: [phone]  Fax: [phone]

## 2021-09-04 NOTE — Progress Notes (Signed)
Physical Therapy Treatment Patient Details Name: Katie Combs MRN: [id number] DOB: 06-25-1950 Today's Date: 09/04/2021   History of Present Illness 71  y.o. F who presents with increased pain and weakness in LLE and difficulty walking. s/p 6/27 L3-4 TLIF and decompression. Significant PMH: depression, DM2, pendulum neuralgia, back surgery.    PT Comments    Pt progressing well with post-op mobility. She was able to demonstrate transfers and ambulation with gross min guard assist up to mod I for transfers and RW for support. Pt was educated on precautions, brace application/wearing schedule, appropriate activity progression, and car transfer. Will continue to follow.      Recommendations for follow up therapy are one component of a multi-disciplinary discharge planning process, led by the attending physician.  Recommendations may be updated based on patient status, additional functional criteria and insurance authorization.  Follow Up Recommendations  Home health PT     Assistance Recommended at Discharge Frequent or constant Supervision/Assistance  Patient can return home with the following A little help with walking and/or transfers;A lot of help with bathing/dressing/bathroom;Assistance with cooking/housework;Assist for transportation;Help with stairs or ramp for entrance   Equipment Recommendations  None recommended by PT    Recommendations for Other Services       Precautions / Restrictions Precautions Precautions: Fall;Back Precaution Comments: able to recall 3/3 back precautions Required Braces or Orthoses: Spinal Brace Spinal Brace: Thoracolumbosacral orthotic;Applied in sitting position Restrictions Weight Bearing Restrictions: No     Mobility  Bed Mobility Overal bed mobility: Modified Independent Bed Mobility: Sidelying to Sit, Sit to Sidelying   Sidelying to sit: Modified independent (Device/Increase time)     Sit to sidelying: Min guard General bed mobility  comments: Use of rail required. Pt transitioned to/from EOB without assist. Required therapist's hand under LE's for elevation back up into bed at end of session but no assist required.    Transfers Overall transfer level: Modified independent Equipment used: Rolling walker (2 wheels) Transfers: Sit to/from Stand             General transfer comment: VC's for proper hand placement on seated surface for safety. No assist required.    Ambulation/Gait Ambulation/Gait assistance: Min guard Gait Distance (Feet): 250 Feet Assistive device: Rolling walker (2 wheels) Gait Pattern/deviations: Step-through pattern, Decreased stride length, Trunk flexed, Shuffle Gait velocity: decreased Gait velocity interpretation: 1.31 - 2.62 ft/sec, indicative of limited community ambulator   General Gait Details: Increased gait speed this session with steady gait. No overt LOB noted.   Stairs Stairs:  (Pt declined stair training. Pt and husband verbally instructed in optimal safety.)           Wheelchair Mobility    Modified Rankin (Stroke Patients Only)       Balance Overall balance assessment: Needs assistance Sitting-balance support: Bilateral upper extremity supported, Feet supported Sitting balance-Leahy Scale: Fair     Standing balance support: No upper extremity supported, During functional activity, Bilateral upper extremity supported Standing balance-Leahy Scale: Fair Standing balance comment: Statically pt was able to stand without UE support however requires UE support for dynamic activity.                            Cognition Arousal/Alertness: Awake/alert Behavior During Therapy: WFL for tasks assessed/performed Overall Cognitive Status: Within Functional Limits for tasks assessed  Exercises      General Comments General comments (skin integrity, edema, etc.): Husband present      Pertinent  Vitals/Pain Pain Assessment Pain Assessment: Faces Faces Pain Scale: Hurts little more Pain Location: back/L hip area Pain Descriptors / Indicators: Discomfort, Grimacing, Operative site guarding Pain Intervention(s): Limited activity within patient's tolerance, Monitored during session, Repositioned    Home Living Family/patient expects to be discharged to:: Private residence Living Arrangements: Spouse/significant other Available Help at Discharge: Family Type of Home: House Home Access: Stairs to enter Entrance Stairs-Rails: None Entrance Stairs-Number of Steps: 1   Home Layout: One level Home Equipment: Agricultural consultant (2 wheels);Shower seat - built in;Grab bars - tub/shower;Hand held shower head;Grab bars - toilet;Adaptive equipment;Toilet riser;BSC/3in1 Lexicographer) Additional Comments: 3 cats - Louie., Mr Murlean Hark and Nadeen Landau    Prior Function            PT Goals (current goals can now be found in the care plan section) Acute Rehab PT Goals Patient Stated Goal: Decrease pain PT Goal Formulation: With patient Time For Goal Achievement: 09/15/21 Potential to Achieve Goals: Good Progress towards PT goals: Progressing toward goals    Frequency    Min 5X/week      PT Plan Current plan remains appropriate    Co-evaluation              AM-PAC PT "6 Clicks" Mobility   Outcome Measure  Help needed turning from your back to your side while in a flat bed without using bedrails?: A Little Help needed moving from lying on your back to sitting on the side of a flat bed without using bedrails?: A Little Help needed moving to and from a bed to a chair (including a wheelchair)?: A Little Help needed standing up from a chair using your arms (e.g., wheelchair or bedside chair)?: A Little Help needed to walk in hospital room?: A Little Help needed climbing 3-5 steps with a railing? : A Little 6 Click Score: 18    End of Session Equipment Utilized During Treatment: Gait  belt;Back brace Activity Tolerance: Patient limited by pain;Patient limited by fatigue Patient left: in bed;with call bell/phone within reach;with family/visitor present Nurse Communication: Mobility status PT Visit Diagnosis: Other abnormalities of gait and mobility (R26.89);Difficulty in walking, not elsewhere classified (R26.2);Pain Pain - Right/Left: Left Pain - part of body: Hip     Time: 8341-9622 PT Time Calculation (min) (ACUTE ONLY): 19 min  Charges:  $Gait Training: 8-22 mins                     Conni Slipper, PT, DPT Acute Rehabilitation Services Secure Chat Preferred Office: [phone]    Marylynn Pearson 09/04/2021, 11:22 AM

## 2021-09-04 NOTE — Assessment & Plan Note (Signed)
noted 

## 2021-09-04 NOTE — Progress Notes (Signed)
Consult NOTE    Katie Combs  [id number] DOB: December 03, 1950 DOA: 08/29/2021 PCP: System, Provider Not In  Chief Complaint  Patient presents with   Back Pain    Brief Narrative:  Katie Combs is Katie Combs pleasant 71 y.o. female with medical history significant for diet-controlled diabetes mellitus, hypertension, anxiety, pudendal neuralgia, and chronic low back pain who presents to the emergency department with severe worsening in her chronic low back pain.  Patient describes chronic low back pain with radiation down the left leg for which she follows with Marlette Curvin spine surgeon and underwent today transforaminal lumbar interbody fusion in 2020.  She has been experiencing increased pain from her low back and left hip with radiation down to her calf.  She has been unable to sit or ambulate without Malaak Combs walker for the past week due to this.  She describes some chronic left leg weakness and feels that that has worsened slightly but she attributes her new mobility problems primarily to pain.  She denies any fevers or chills, change in bowel or bladder control, or new saddle anesthesia.   ED Course: Upon arrival to the ED, patient is found to be afebrile and saturating well on room air with stable blood pressure.  Plain films of the lumbar spine are negative for acute findings.  ED discussed the case with Dr. Lynann Bologna of orthopedic surgery who recommended admission for pain control and indicated that the may be able to expedite her surgery that had been planned to take place in 2 weeks.  She's now s/p surgery with orthopedics.  They've now taken over.  Hospitalist consulting at this time.  Discharge recs Lisinopril and hctz on hold.  Started on hydralazine.  Discuss with PCP outpatient to determine whether to resume this outpatient based on BP and renal function outpatient  Follow renal function outpatient.  Limit NSAIDs. Follow diabetes outpatient  Follow anemia outpatient     Assessment & Plan:   Principal  Problem:   Acute left-sided low back pain with sciatica Active Problems:   Constipation   Type 2 diabetes mellitus (HCC)   Dyslipidemia   Hypertension   Stage 3a chronic kidney disease (CKD) (HCC)   AKI (acute kidney injury) (HCC)   Anxiety   Hyponatremia   Anemia   Obesity (BMI 30-39.9)   Assessment and Plan: * Acute left-sided low back pain with sciatica MRI 07/28/2021 with diffuse spondylosis (progressed since prior MRI), at L3-4 degenerative changes with inferiorly migrating left central disc extrusion and epidural lipomatosis result in moderate spinal canal stenosis with narrowing of the bilateral subarticular zones, severe on the L, moderate right, and severe left neural narrowing.  At l1-2 narrowing of bilateral subarticular zones, severe right and moderate left neural foraminal narrowing.  At L2-3 narrowing of the bilateral subarticular moderate right and mild left neural foraminal.  At L4-5, T1 hypointense tissue in the L neural foramen may represent post surgical fibrosis. S/p L3-4 decompression, L sided L3-4 transforaminal lumbar interbody fusion, right sided L3-4 posterolateral fusion, insersion of interbody device x1, placement of posterior instrumentation at L3, L4 bilaterally, use of local autograft, use of morselized allograft, intraoperative use of fluoroscopy, exploration of spinal fusion L4-5 Pain management per orthopedics, PT/OT Discharge per orthopedics  Constipation Bowel regimen  Type 2 diabetes mellitus ([place]) a1c 6.9 (in diabetic range June 2021) Most recent A1c is 6.3 Follow outpatient with PCP  Dyslipidemia crestor  Hypertension bystolic and hydralazine, BP doing ok with these meds Follow outpatient and review  with PCP Lisinopril and HCTZ discontinued -> she's been started on hydralazine 25 mg BID  AKI (acute kidney injury) (HCC) Improved with adjustment of BP meds, holding nsaids, IVF Recommend limiting NSAIDs after discharge Lisinopril/HCTZ  currently on hold, can follow with PCP outpatient and discuss potential resumption based on BP and renal function outpatient  Anxiety Cymbalta, klonopin  Hyponatremia mild  Anemia downtrended post op and with IVF Follow outpatient   Obesity (BMI 30-39.9) noted      DVT prophylaxis: SCD Code Status: full Family Communication: husband at bedside Disposition:   Status is: Inpatient Remains inpatient appropriate because: discharge per orthopedics   Consultants:  orthopedics  Procedures:  S/p L3-4 decompression, L sided L3-4 transforaminal lumbar interbody fusion, right sided L3-4 posterolateral fusion, insersion of interbody device x1, placement of posterior instrumentation at L3, L4 bilaterally, use of local autograft, use of morselized allograft, intraoperative use of fluoroscopy, exploration of spinal fusion L4-5  Antimicrobials:  Anti-infectives (From admission, onward)    Start     Dose/Rate Route Frequency Ordered Stop   09/03/21 0600  ceFAZolin (ANCEF) IVPB 2g/100 mL premix        2 g 200 mL/hr over 30 Minutes Intravenous On call to O.R. 09/02/21 1207 09/02/21 1830   09/03/21 0230  ceFAZolin (ANCEF) IVPB 2g/100 mL premix        2 g 200 mL/hr over 30 Minutes Intravenous Every 8 hours 09/02/21 2024 09/03/21 1155   09/02/21 1211  ceFAZolin (ANCEF) 2-4 GM/100ML-% IVPB       Note to Pharmacy: Shanda Bumps M: cabinet override      09/02/21 1211 09/03/21 0014       Subjective: No new complaints, improved pain Discussed d/c plans - discussed pain meds (discussed limiting nsaids with aki, tylenol ok to use, but hold for now with use of percocet at doses close to maximum tylenol dose)  - husband at bedside  Objective: Vitals:   09/03/21 2003 09/03/21 2353 09/04/21 0251 09/04/21 0725  BP: (!) 167/86 (!) 104/50 (!) 136/59 136/60  Pulse: 100 76 81 80  Resp: 18 18 18 16   Temp: 99 F (37.2 C) 99.3 F (37.4 C) 98.6 F (37 C) 98.8 F (37.1 C)  TempSrc: Oral Oral  Oral Oral  SpO2: 98% 98% 100% 96%  Weight:      Height:        Intake/Output Summary (Last 24 hours) at 09/04/2021 0749 Last data filed at 09/03/2021 1310 Gross per 24 hour  Intake 360 ml  Output --  Net 360 ml   Filed Weights   08/29/21 1239 09/02/21 1210  Weight: 90.3 kg 90.3 kg    Examination:  General: No acute distress. Cardiovascular: RRR Lungs: unlabored Neurological: Alert and oriented 3. Moves all extremities 4. Cranial nerves II through XII grossly intact. Extremities: No clubbing or cyanosis. No edema.   Data Reviewed: I have personally reviewed following labs and imaging studies  CBC: Recent Labs  Lab 08/29/21 1819 08/30/21 0450 08/31/21 0208 09/01/21 0040 09/02/21 0231 09/03/21 0901  WBC 10.6* 11.8* 11.3* 8.1 8.7 12.1*  NEUTROABS 8.3*  --   --   --   --   --   HGB 16.3* 15.0 13.5 11.9* 11.9* 9.2*  HCT 48.2* 45.7 42.0 36.8 35.7* 27.7*  MCV 83.1 83.2 85.2 84.6 83.8 84.2  PLT 371 363 308 268 264 225    Basic Metabolic Panel: Recent Labs  Lab 08/30/21 0450 08/31/21 0208 09/01/21 0040 09/02/21 0231 09/03/21 0901  NA 131* 130* 129* 138 133*  K 4.0 4.4 3.6 3.8 3.6  CL 95* 95* 99 100 98  CO2 26 24 25 25 25   GLUCOSE 112* 117* 105* 97 191*  BUN 27* 35* 28* 18 16  CREATININE 1.43* 1.52* 1.14* 1.09* 1.17*  CALCIUM 10.2 9.6 8.9 9.7 9.0  PHOS  --   --  3.2  --   --     GFR: Estimated Creatinine Clearance: 49.9 mL/min (Yailene Badia) (by C-G formula based on SCr of 1.17 mg/dL (H)).  Liver Function Tests: Recent Labs  Lab 09/01/21 0040  ALBUMIN 3.2*    CBG: Recent Labs  Lab 08/30/21 1838 08/30/21 2138 08/31/21 0908 08/31/21 1145 09/02/21 1918  GLUCAP 177* 142* 102* 137* 144*     Recent Results (from the past 240 hour(s))  Urine Culture     Status: Abnormal   Collection Time: 08/31/21  9:01 AM   Specimen: Urine, Catheterized  Result Value Ref Range Status   Specimen Description URINE, CATHETERIZED  Final   Special Requests NONE  Final    Culture (Otoniel Myhand)  Final    <10,000 COLONIES/mL INSIGNIFICANT GROWTH Performed at [facility], [address] [place], [place]    Report Status 09/02/2021 FINAL  Final  Surgical pcr screen     Status: Abnormal   Collection Time: 09/01/21 11:18 PM   Specimen: Nasal Mucosa; Nasal Swab  Result Value Ref Range Status   MRSA, PCR NEGATIVE NEGATIVE Final   Staphylococcus aureus POSITIVE (Lenin Kuhnle) NEGATIVE Final    Comment: (NOTE) The Xpert SA Assay (FDA approved for NASAL specimens in patients 68 years of age and older), is one component of Daymian Lill comprehensive surveillance program. It is not intended to diagnose infection nor to guide or monitor treatment. Performed at [facility], [address], [place], [place]          Radiology Studies: DG Lumbar Spine 2-3 Views  Result Date: 09/02/2021 CLINICAL DATA:  L3-4 fusion EXAM: LUMBAR SPINE - 2-3 VIEW COMPARISON:  08/29/2021 FINDINGS: Intraoperative imaging demonstrates extension of the posterior fusion changes to include L3-4. No hardware complicating feature. IMPRESSION: L3-4 fusion.  No visible complicating feature. Electronically Signed   By: 08/31/2021 M.D.   On: 09/02/2021 19:50   DG C-Arm 1-60 Min-No Report  Result Date: 09/02/2021 Fluoroscopy was utilized by the requesting physician.  No radiographic interpretation.   DG C-Arm 1-60 Min-No Report  Result Date: 09/02/2021 Fluoroscopy was utilized by the requesting physician.  No radiographic interpretation.   DG C-Arm 1-60 Min-No Report  Result Date: 09/02/2021 Fluoroscopy was utilized by the requesting physician.  No radiographic interpretation.   DG C-Arm 1-60 Min-No Report  Result Date: 09/02/2021 Fluoroscopy was utilized by the requesting physician.  No radiographic interpretation.   DG C-Arm 1-60 Min-No Report  Result Date: 09/02/2021 Fluoroscopy was utilized by the requesting physician.  No radiographic interpretation.   DG Lumbar Spine 1  View  Result Date: 09/02/2021 CLINICAL DATA:  Localization for LEFT L3-L4 TLIF EXAM: LUMBAR SPINE - 1 VIEW COMPARISON:  Portable exam 1428 hours compared to 08/29/2021 FINDINGS: Five lumbar vertebra on prior exam. Prior posterior fusion L4-L5 with intervening disc prosthesis. 2 metallic probes via dorsal approach project dorsal to the spinous processes of L2 and L3. IMPRESSION: Dorsal localization of the spinous processes of L2 and L3. Electronically Signed   By: 08/31/2021 M.D.   On: 09/02/2021 17:10  Scheduled Meds:  clonazePAM  1 mg Oral QHS   docusate sodium  100 mg Oral BID   DULoxetine  30 mg Oral Daily   gabapentin  800 mg Oral QHS   hydrALAZINE  25 mg Oral BID   loratadine  10 mg Oral Daily   multivitamin with minerals  1 tablet Oral Daily   nebivolol  5 mg Oral QHS   oxyCODONE  20 mg Oral Q12H   rosuvastatin  2.5 mg Oral QHS   senna-docusate  1 tablet Oral QHS   sodium chloride flush  3 mL Intravenous Q12H   tiZANidine  2 mg Oral QHS   Continuous Infusions:  sodium chloride 20 mL/hr at 09/03/21 0232   0.9 % NaCl with KCl 20 mEq / L       LOS: 6 days    Time spent: over 30 min    Fayrene Helper, MD [facility]   To contact the attending provider between 7A-7P or the covering provider during after hours 7P-7A, please log into the web site [email or web address] and access using universal [place] password for that web site. If you do not have the password, please call the hospital operator.  09/04/2021, 7:49 AM

## 2021-09-04 NOTE — Assessment & Plan Note (Signed)
downtrended post op and with IVF Follow outpatient

## 2021-09-04 NOTE — Plan of Care (Signed)
Pt and husband given D/C instructions with verbal understanding. Rx's were sent to the pharmacy by MD. Pt's incision is clean and dry with no sign of infection. Pt's IV and JP drain were removed prior to D/C. Home Health was arranged by [facility] per MD order. Pt D/C'd home via wheelchair per MD order. Pt is stable @ D/C and has no other needs at this time. Rema Fendt, RN

## 2021-09-04 NOTE — Progress Notes (Signed)
    Patient doing well  Has been ambulating   Physical Exam: Vitals:   09/03/21 2353 09/04/21 0251  BP: (!) 104/50 (!) 136/59  Pulse: 76 81  Resp: 18 18  Temp: 99.3 F (37.4 C) 98.6 F (37 C)  SpO2: 98% 100%    Dressing in place NVI  POD #2 s/p lumbar decompression and fusion, doing well  - up with PT/OT, encourage ambulation - Dilaudid and oxycontin for pain, Robaxin for muscle spasms - likely d/c home today with f/u in 2 weeks  - will d/c on hydralazine per Dr. Ledell Peoples recommendations - I d/c'd labs for today as she is being discharged

## 2021-09-04 NOTE — Progress Notes (Signed)
Occupational Therapy Treatment Patient Details Name: Katie Combs MRN: [id number] DOB: [birth year] Today's Date: 09/04/2021   History of present illness 71  y.o. F who presents with increased pain and weakness in LLE and difficulty walking. s/p 6/27 L3-4 TLIF and decompression. Significant PMH: depression, DM2, pendulum neuralgia, back surgery.   OT comments  Pt progressing well towards OT goals though still limited by chronic L hip pain. Session focused on implementation of back precautions during ADLs and management of TLSO brace. After problem solving cues, pt able to successfully don pajamas without physical assistance and husband able to return demo TLSO brace mgmt without cues. Reinforced frequent mobility at home, positioning in bed and compensatory strategies for LB ADLs. Continue to rec HHOT at DC to maximize independence with daily tasks.    Recommendations for follow up therapy are one component of a multi-disciplinary discharge planning process, led by the attending physician.  Recommendations may be updated based on patient status, additional functional criteria and insurance authorization.    Follow Up Recommendations  Home health OT    Assistance Recommended at Discharge PRN  Patient can return home with the following  A little help with walking and/or transfers;A little help with bathing/dressing/bathroom;Assistance with cooking/housework;Assist for transportation   Equipment Recommendations  None recommended by OT    Recommendations for Other Services      Precautions / Restrictions Precautions Precautions: Fall;Back Precaution Comments: able to recall 3/3 back precautions Required Braces or Orthoses: Spinal Brace Spinal Brace: Thoracolumbosacral orthotic;Applied in sitting position Restrictions Weight Bearing Restrictions: No       Mobility Bed Mobility Overal bed mobility: Modified Independent Bed Mobility: Rolling, Sidelying to Sit Rolling: Modified  independent (Device/Increase time) Sidelying to sit: Modified independent (Device/Increase time)     Sit to sidelying: Modified independent (Device/Increase time) General bed mobility comments: able to return demo log rolling well    Transfers Overall transfer level: Modified independent Equipment used: Rolling walker (2 wheels) Transfers: Sit to/from Stand Sit to Stand: Modified independent (Device/Increase time)                 Balance Overall balance assessment: Needs assistance Sitting-balance support: Bilateral upper extremity supported, Feet supported Sitting balance-Leahy Scale: Fair     Standing balance support: No upper extremity supported, During functional activity, Bilateral upper extremity supported Standing balance-Leahy Scale: Fair Standing balance comment: able to stand statically for LB ADls, BUE support for mobility                           ADL either performed or assessed with clinical judgement   ADL Overall ADL's : Needs assistance/impaired Eating/Feeding: Independent Eating/Feeding Details (indicate cue type and reason): sitting EOB drinking from cup             Upper Body Dressing : Set up;Sitting Upper Body Dressing Details (indicate cue type and reason): able to don shirt w/o assist. Husband present, able to return demo assisting pt with TLSO without cues from OT. educated on tightening straps in standing if needed for snug fit Lower Body Dressing: Minimal assistance;Sitting/lateral leans;Sit to/from stand Lower Body Dressing Details (indicate cue type and reason): able to doff socks using alternative feet bed level, will need assist to don if not using a sock aid. Pt with diffiuclty donning L LE into pajama pants - educated to sit fully upright while holding pants legs slightly lower for L LE to go in first (as this is  the most difficult LE to manage) with pt able to return to seated R leaning to don over R LE without assist                General ADL Comments: Emphasis on TLSO brace mgmt, task modification of LB ADLs to offset chronic L hip pain when in sitting position    Extremity/Trunk Assessment Upper Extremity Assessment Upper Extremity Assessment: Overall WFL for tasks assessed   Lower Extremity Assessment Lower Extremity Assessment: Defer to PT evaluation        Vision   Vision Assessment?: No apparent visual deficits   Perception     Praxis      Cognition Arousal/Alertness: Awake/alert Behavior During Therapy: WFL for tasks assessed/performed Overall Cognitive Status: Within Functional Limits for tasks assessed                                          Exercises      Shoulder Instructions       General Comments Husband present    Pertinent Vitals/ Pain       Pain Assessment Pain Assessment: Faces Faces Pain Scale: Hurts little more Pain Location: back/L hip area Pain Descriptors / Indicators: Discomfort, Grimacing, Operative site guarding Pain Intervention(s): Monitored during session, Limited activity within patient's tolerance, Repositioned  Home Living                                          Prior Functioning/Environment              Frequency  Min 2X/week        Progress Toward Goals  OT Goals(current goals can now be found in the care plan section)  Progress towards OT goals: Progressing toward goals  Acute Rehab OT Goals Patient Stated Goal: home today, continued improvements in back pain OT Goal Formulation: With patient Time For Goal Achievement: 09/17/21 Potential to Achieve Goals: Good ADL Goals Pt Will Perform Lower Body Dressing: with modified independence;sit to/from stand;with adaptive equipment Pt Will Transfer to Toilet: with modified independence;ambulating Pt Will Perform Toileting - Clothing Manipulation and hygiene: with modified independence;sit to/from stand Additional ADL Goal #1: Pt will complete  bed transfer mod I with 100% back precautions.  Plan Discharge plan remains appropriate    Co-evaluation                 AM-PAC OT "6 Clicks" Daily Activity     Outcome Measure   Help from another person eating meals?: None Help from another person taking care of personal grooming?: None Help from another person toileting, which includes using toliet, bedpan, or urinal?: A Little Help from another person bathing (including washing, rinsing, drying)?: A Little Help from another person to put on and taking off regular upper body clothing?: A Little Help from another person to put on and taking off regular lower body clothing?: A Little 6 Click Score: 20    End of Session Equipment Utilized During Treatment: Rolling walker (2 wheels);Back brace  OT Visit Diagnosis: Unsteadiness on feet (R26.81) Pain - Right/Left: Left Pain - part of body: Hip   Activity Tolerance Patient tolerated treatment well   Patient Left in bed;with call bell/phone within reach;with family/visitor present   Nurse Communication Mobility status  Time: 4818-5631 OT Time Calculation (min): 16 min  Charges: OT General Charges $OT Visit: 1 Visit OT Treatments $Self Care/Home Management : 8-22 mins  Bradd Canary, OTR/L Acute Rehab Services Office: [phone]   Lorre Munroe 09/04/2021, 10:32 AM

## 2021-09-17 NOTE — Discharge Summary (Signed)
Patient ID: Katie Combs MRN: [id number] DOB/AGE: 70-Aug-1952 70 y.o.  Admit date: 08/29/2021 Discharge date: 09/04/2021  Admission Diagnoses:  Principal Problem:   Acute left-sided low back pain with sciatica Active Problems:   Type 2 diabetes mellitus (HCC)   Hypertension   Anxiety   Stage 3a chronic kidney disease (CKD) (HCC)   Hyponatremia   Constipation   Dyslipidemia   AKI (acute kidney injury) ([place])   Obesity (BMI 30-39.9)   Anemia   Discharge Diagnoses:  Same  Past Medical History:  Diagnosis Date   Anxiety    Arthritis    DDD (degenerative disc disease), lumbar    Depression    Endometriosis    GERD (gastroesophageal reflux disease)    Hypertension    Neuromuscular disorder (HCC)    pendulum neuralgia   PONV (postoperative nausea and vomiting)    Pre-diabetes    Prediabetes    Pudendal neuralgia    Seasonal allergies    Type 2 diabetes mellitus without complications ([place])     Surgeries: Procedure(s): LEFT-SIDED LUMBAR THREE - LUMBAR FOUR TRANSFORAMINAL LUMBAR INTERBODY FUSION AND DECOMPRESSION WITH INSTRUMENTATION AND ALLOGRAFT on 09/02/2021   Consultants: None  Discharged Condition: Improved  Hospital Course: TERAJI PALUCK is an 71 y.o. female who was admitted 08/29/2021 for operative treatment of Acute left-sided low back pain with sciatica. Patient has severe unremitting pain that affects sleep, daily activities, and work/hobbies. After pre-op clearance the patient was taken to the operating room on 09/02/2021 and underwent  Procedure(s): LEFT-SIDED LUMBAR THREE - LUMBAR FOUR TRANSFORAMINAL LUMBAR INTERBODY FUSION AND DECOMPRESSION WITH INSTRUMENTATION AND ALLOGRAFT.    Patient was given perioperative antibiotics:  Anti-infectives (From admission, onward)    Start     Dose/Rate Route Frequency Ordered Stop   09/03/21 0600  ceFAZolin (ANCEF) IVPB 2g/100 mL premix        2 g 200 mL/hr over 30 Minutes Intravenous On call to O.R. 09/02/21 1207  09/02/21 1830   09/03/21 0230  ceFAZolin (ANCEF) IVPB 2g/100 mL premix        2 g 200 mL/hr over 30 Minutes Intravenous Every 8 hours 09/02/21 2024 09/03/21 1155   09/02/21 1211  ceFAZolin (ANCEF) 2-4 GM/100ML-% IVPB       Note to Pharmacy: Cameron Sprang M: cabinet override      09/02/21 1211 09/03/21 0014        Patient was given sequential compression devices, early ambulation to prevent DVT.  Patient benefited maximally from hospital stay and there were no complications.    Recent vital signs: BP 136/60 (BP Location: Left Arm)   Pulse 80   Temp 98.8 F (37.1 C) (Oral)   Resp 16   Ht 5' 4.5" (1.638 m)   Wt 90.3 kg   SpO2 96%   BMI 33.64 kg/m    Discharge Medications:   Allergies as of 09/04/2021       Reactions   Morphine And Related Anaphylaxis   Macrodantin [nitrofurantoin Macrocrystal] Other (See Comments)   Flu like symptoms   Other Other (See Comments)   Synthetic Sutures do not heal and had to be removed    5-alpha Reductase Inhibitors    Unknown reaction - pt unaware of ever taking this    Adhesive [tape] Rash   Tetanus Toxoids Other (See Comments)   Developed a bad sore on her arm and ran a fever for 1 week        Medication List  STOP taking these medications    ibuprofen 200 MG tablet Commonly known as: ADVIL   lisinopril 20 MG tablet Commonly known as: ZESTRIL   naproxen 500 MG tablet Commonly known as: NAPROSYN   Suboxone 8-2 MG Film Generic drug: Buprenorphine HCl-Naloxone HCl   triamterene-hydrochlorothiazide 37.5-25 MG capsule Commonly known as: DYAZIDE       TAKE these medications    aspirin EC 81 MG tablet Take 81 mg by mouth daily.   CAL-MAG-ZINC PO Take 1 tablet by mouth daily.   cetirizine 10 MG tablet Commonly known as: ZYRTEC Take 10 mg by mouth at bedtime as needed for allergies.   CLA PO Take 2 capsules by mouth daily.   clonazePAM 1 MG tablet Commonly known as: KLONOPIN Take 1 mg by mouth See admin  instructions. Take 1 mg at night, may take 1 mg during the day as needed for pudendal neuralgia   CoQ10 100 MG Caps Take 100 mg by mouth daily.   DULoxetine 30 MG capsule Commonly known as: CYMBALTA Take 30 mg by mouth daily.   gabapentin 800 MG tablet Commonly known as: NEURONTIN Take 800-1,600 mg by mouth See admin instructions. Take 1600 mg by mouth in the morning and 800 mg at night   hydrALAZINE 25 MG tablet Commonly known as: APRESOLINE Take 1 tablet (25 mg total) by mouth 2 (two) times daily.   ketoconazole 2 % cream Commonly known as: NIZORAL Apply 1 Application topically 2 (two) times daily as needed for rash.   lansoprazole 30 MG capsule Commonly known as: PREVACID Take 30 mg by mouth 2 (two) times daily as needed (heartburn).   lidocaine 2 % solution Commonly known as: XYLOCAINE See admin instructions. Insert 1 dose vaginally as needed for pudendal neuralgia   loratadine 10 MG tablet Commonly known as: CLARITIN Take 10 mg by mouth daily as needed for allergies.   methocarbamol 750 MG tablet Commonly known as: ROBAXIN Take 1 tablet (750 mg total) by mouth every 6 (six) hours as needed for muscle spasms.   multivitamin with minerals Tabs tablet Take 1 tablet by mouth daily.   nebivolol 10 MG tablet Commonly known as: BYSTOLIC Take 5 mg by mouth at bedtime.   nystatin-triamcinolone cream Commonly known as: MYCOLOG II Apply 1 Application topically 2 (two) times daily as needed (rash).   oxyCODONE 10 mg 12 hr tablet Commonly known as: OXYCONTIN Take 2 tablets (20 mg total) by mouth every 12 (twelve) hours for 14 days.   oxyCODONE-acetaminophen 5-325 MG tablet Commonly known as: PERCOCET/ROXICET Take 1-2 tablets by mouth every 4 (four) hours as needed for moderate pain.   rosuvastatin 5 MG tablet Commonly known as: CRESTOR Take 2.5 mg by mouth at bedtime.   tiZANidine 2 MG tablet Commonly known as: ZANAFLEX Take 2 mg by mouth at bedtime.         Diagnostic Studies: DG Lumbar Spine 2-3 Views  Result Date: 09/02/2021 CLINICAL DATA:  L3-4 fusion EXAM: LUMBAR SPINE - 2-3 VIEW COMPARISON:  08/29/2021 FINDINGS: Intraoperative imaging demonstrates extension of the posterior fusion changes to include L3-4. No hardware complicating feature. IMPRESSION: L3-4 fusion.  No visible complicating feature. Electronically Signed   By: Charlett Nose M.D.   On: 09/02/2021 19:50   DG C-Arm 1-60 Min-No Report  Result Date: 09/02/2021 Fluoroscopy was utilized by the requesting physician.  No radiographic interpretation.   DG C-Arm 1-60 Min-No Report  Result Date: 09/02/2021 Fluoroscopy was utilized by the requesting physician.  No radiographic  interpretation.   DG C-Arm 1-60 Min-No Report  Result Date: 09/02/2021 Fluoroscopy was utilized by the requesting physician.  No radiographic interpretation.   DG C-Arm 1-60 Min-No Report  Result Date: 09/02/2021 Fluoroscopy was utilized by the requesting physician.  No radiographic interpretation.   DG C-Arm 1-60 Min-No Report  Result Date: 09/02/2021 Fluoroscopy was utilized by the requesting physician.  No radiographic interpretation.   DG Lumbar Spine 1 View  Result Date: 09/02/2021 CLINICAL DATA:  Localization for LEFT L3-L4 TLIF EXAM: LUMBAR SPINE - 1 VIEW COMPARISON:  Portable exam 1428 hours compared to 08/29/2021 FINDINGS: Five lumbar vertebra on prior exam. Prior posterior fusion L4-L5 with intervening disc prosthesis. 2 metallic probes via dorsal approach project dorsal to the spinous processes of L2 and L3. IMPRESSION: Dorsal localization of the spinous processes of L2 and L3. Electronically Signed   By: Lavonia Dana M.D.   On: 09/02/2021 17:10   DG Lumbar Spine Complete  Result Date: 08/29/2021 CLINICAL DATA:  Low back pain EXAM: LUMBAR SPINE - COMPLETE 4+ VIEW COMPARISON:  MR lumbar spine done on 07/27/2021 FINDINGS: No recent fracture is seen. Levoscoliosis is seen. There is minimal  anterolisthesis at L4-L5 level. There is previous surgical fusion at L4-L5 level. Last lumbar vertebra is transitional. Degenerative changes are noted with disc space narrowing, bony spurs and facet hypertrophy throughout lumbar spine. Surgical clips are seen in gallbladder fossa. IMPRESSION: No recent fracture is seen. There is posterior surgical fusion at L4-L5 level. Lumbar spondylosis with disc space narrowing, bony spurs and facet hypertrophy at multiple levels. Electronically Signed   By: Elmer Picker M.D.   On: 08/29/2021 13:17   CT BIOPSY  Result Date: 08/22/2021 CLINICAL DATA:  Chronic low back pain, unspecified back pain laterality, unspecified whether sciatica present EXAM: CT-guided left SI joint injection RADIATION DOSE REDUCTION: This exam was performed according to the departmental dose-optimization program which includes automated exposure control, adjustment of the mA and/or kV according to patient size and/or use of iterative reconstruction technique. PROCEDURE: After a thorough discussion of risks and benefits of the procedure, including bleeding, infection, injury to nerves, blood vessels, and adjacent structures, verbal and written consent was obtained. Specific risks of the procedure included nondiagnostic/nontherapeutic injection and non target injection. The patient was placed prone on the CT table and localization was performed over the sacrum. Target site marked using CT guidance. The skin was prepped and draped in the usual sterile fashion using Betadine soap. After local anesthesia with 1% lidocaine without epinephrine and subsequent deep anesthesia, a 22 gauge spinal needle was advanced into the left SI joint under intermittent CT guidance. Once the needle was in satisfactory position, representative image was captured with the needle demonstrated in the sacroiliac joint. Subsequently, 3 mL 0.5% bupivacaine was injected into the left SI joint. Needles removed and a sterile  dressing applied. No complications were observed. IMPRESSION: Successful CT-guided left SI joint injection with anesthetic only. Patient reported mild improvement in left lower extremity symptoms shortly following the injection. Electronically Signed   By: Albin Felling M.D.   On: 08/22/2021 11:58    Disposition: Discharge disposition: 01-Home or Self Care        POD #2 s/p lumbar decompression and fusion, doing well   - up with PT/OT, encourage ambulation - Dilaudid and oxycontin for pain, Robaxin for muscle spasms - likely d/c home today with f/u in 2 weeks  - will d/c on hydralazine per Dr. Buford Dresser recommendations -Scripts for pain sent to  pharmacy electronically  -D/C instructions sheet printed and in chart -D/C today  -F/U in office 2 weeks   Signed: Georga Bora 09/17/2021, 2:54 PM

## 2021-09-24 ENCOUNTER — Other Ambulatory Visit (HOSPITAL_COMMUNITY): Payer: Self-pay

## 2021-09-26 ENCOUNTER — Other Ambulatory Visit (HOSPITAL_COMMUNITY): Payer: Self-pay

## 2021-11-06 ENCOUNTER — Other Ambulatory Visit (HOSPITAL_BASED_OUTPATIENT_CLINIC_OR_DEPARTMENT_OTHER): Payer: Self-pay | Admitting: Physician Assistant

## 2021-11-06 ENCOUNTER — Other Ambulatory Visit (HOSPITAL_BASED_OUTPATIENT_CLINIC_OR_DEPARTMENT_OTHER): Payer: Self-pay | Admitting: *Deleted

## 2021-11-06 DIAGNOSIS — Z1231 Encounter for screening mammogram for malignant neoplasm of breast: Secondary | ICD-10-CM

## 2021-11-17 ENCOUNTER — Encounter (HOSPITAL_BASED_OUTPATIENT_CLINIC_OR_DEPARTMENT_OTHER): Payer: Self-pay

## 2021-11-17 ENCOUNTER — Ambulatory Visit (HOSPITAL_BASED_OUTPATIENT_CLINIC_OR_DEPARTMENT_OTHER)
Admission: RE | Admit: 2021-11-17 | Discharge: 2021-11-17 | Disposition: A | Discharge status: Home / Self Care | Payer: Medicare PPO | Source: Ambulatory Visit | Attending: Physician Assistant | Admitting: Physician Assistant

## 2021-11-17 DIAGNOSIS — Z1231 Encounter for screening mammogram for malignant neoplasm of breast: Secondary | ICD-10-CM | POA: Diagnosis present

## 2021-11-21 ENCOUNTER — Telehealth: Payer: Self-pay

## 2021-11-21 NOTE — Telephone Encounter (Addendum)
Called patient regarding results. Left message for patient to call office.----- Message from Cannon Kettle, PA-C sent at 11/19/2021 10:44 AM EDT ----- There are no findings suspicious for malignancy.

## 2021-12-11 NOTE — Telephone Encounter (Signed)
Lmom to discuss lab results. Waiting on a return call.  

## 2022-08-11 IMAGING — CR DG LUMBAR SPINE COMPLETE 4+V
5 series · 5 of 5 positions shown · non-contrast
Comparison: MR lumbar spine done on 07/27/2021

CLINICAL DATA: Low back pain

EXAM:
LUMBAR SPINE - COMPLETE 4+ VIEW

[l-spine ap]
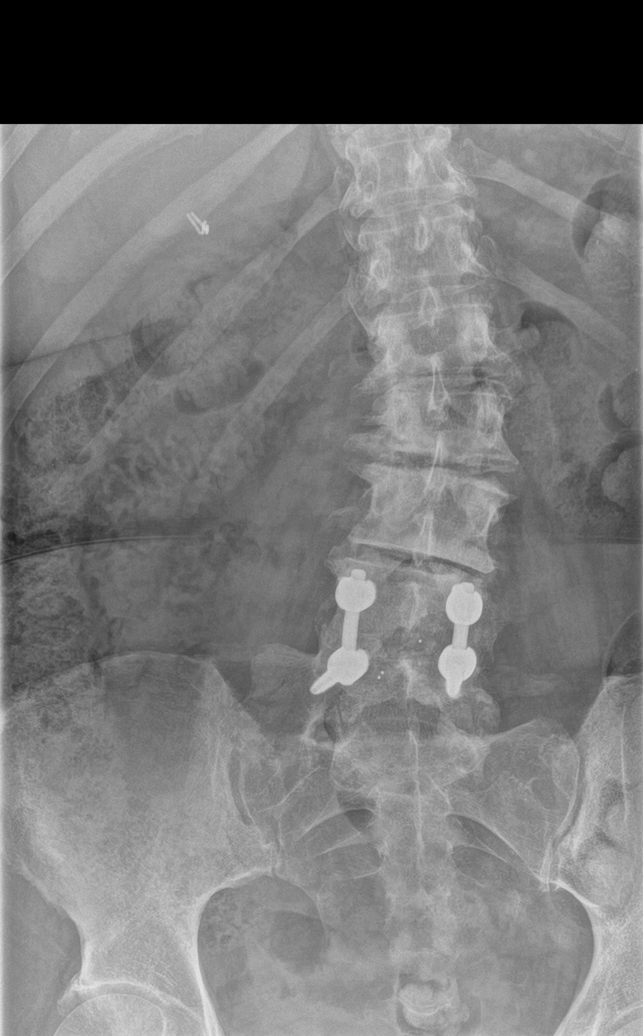

[l-spine obl (1 of 2)]
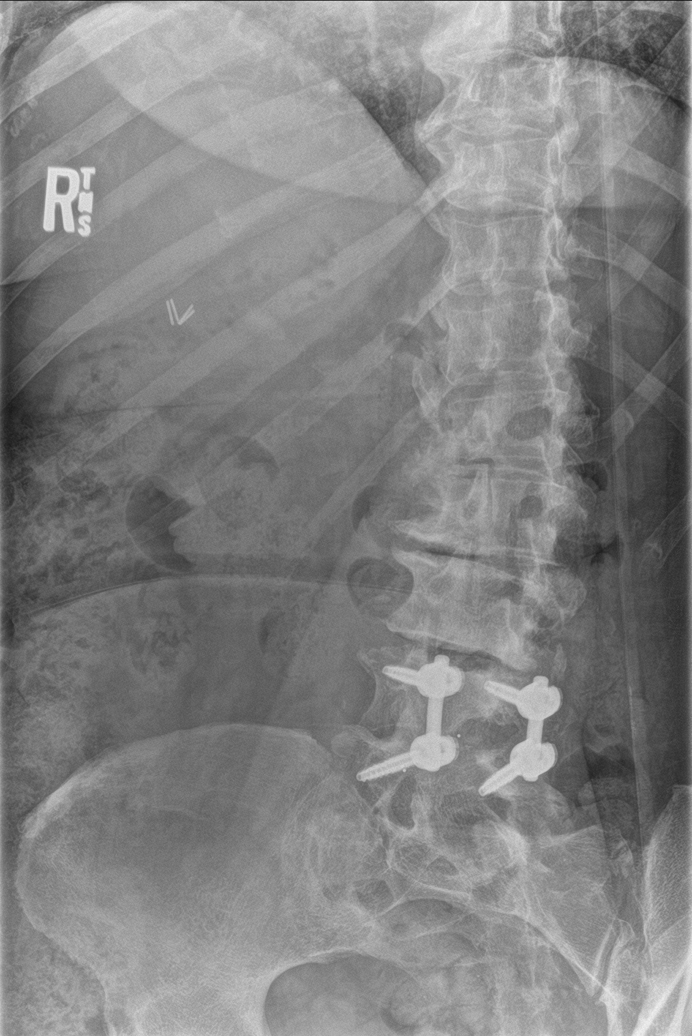

[l-spine obl (2 of 2)]
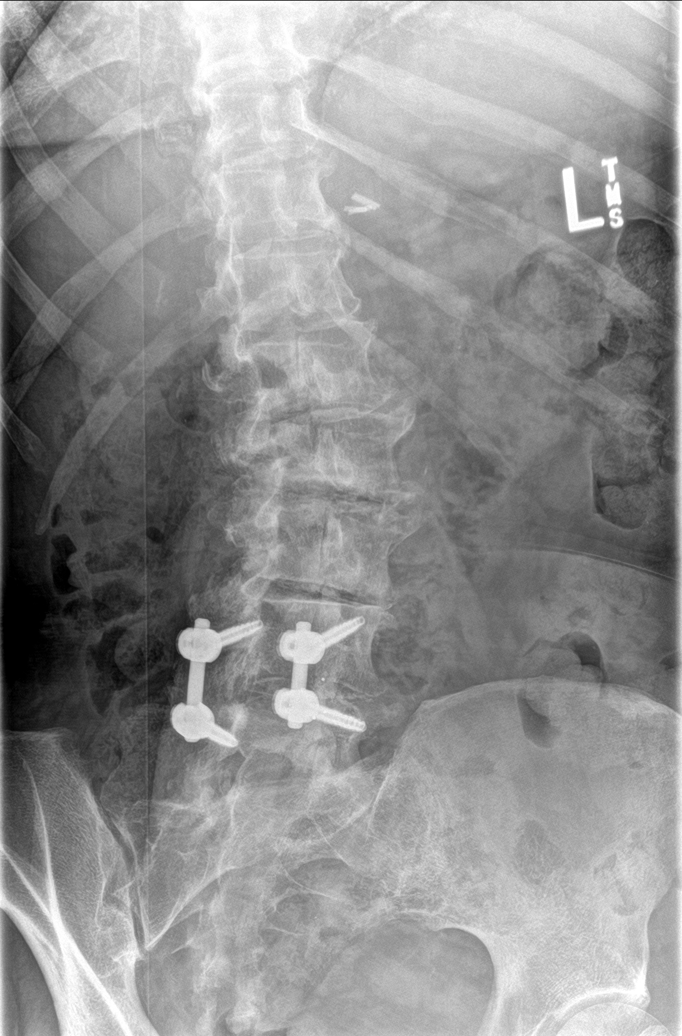

[l-spine lat]
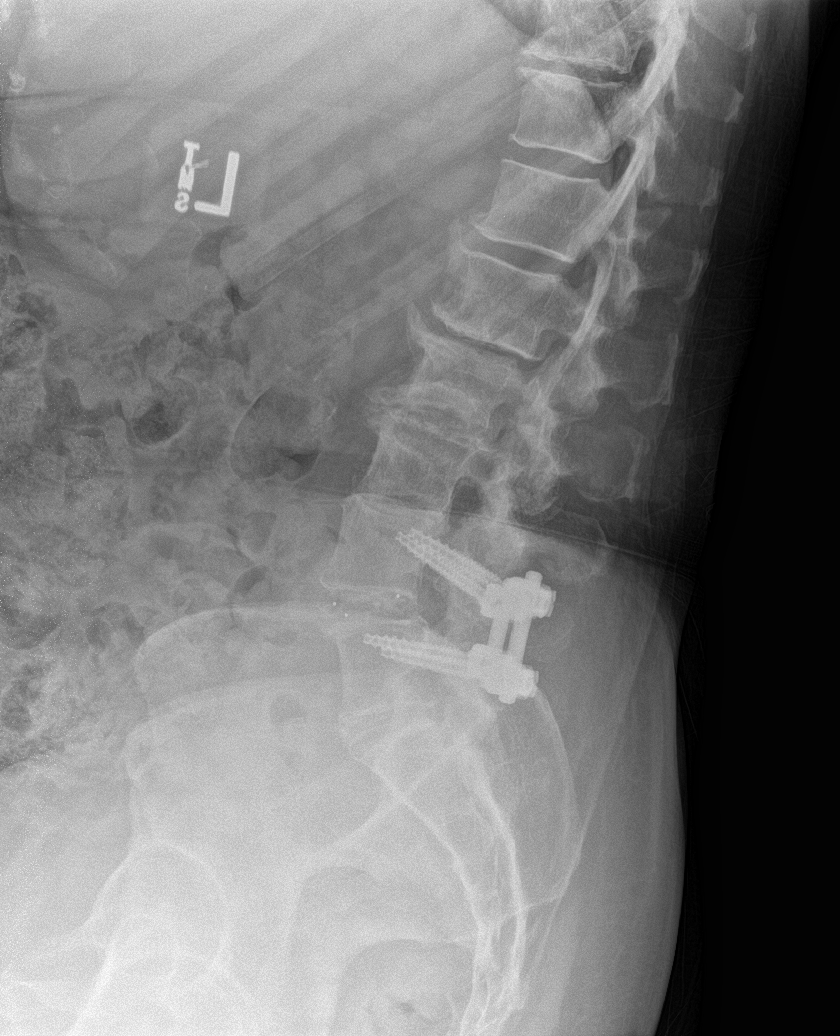

[l-spine spot]
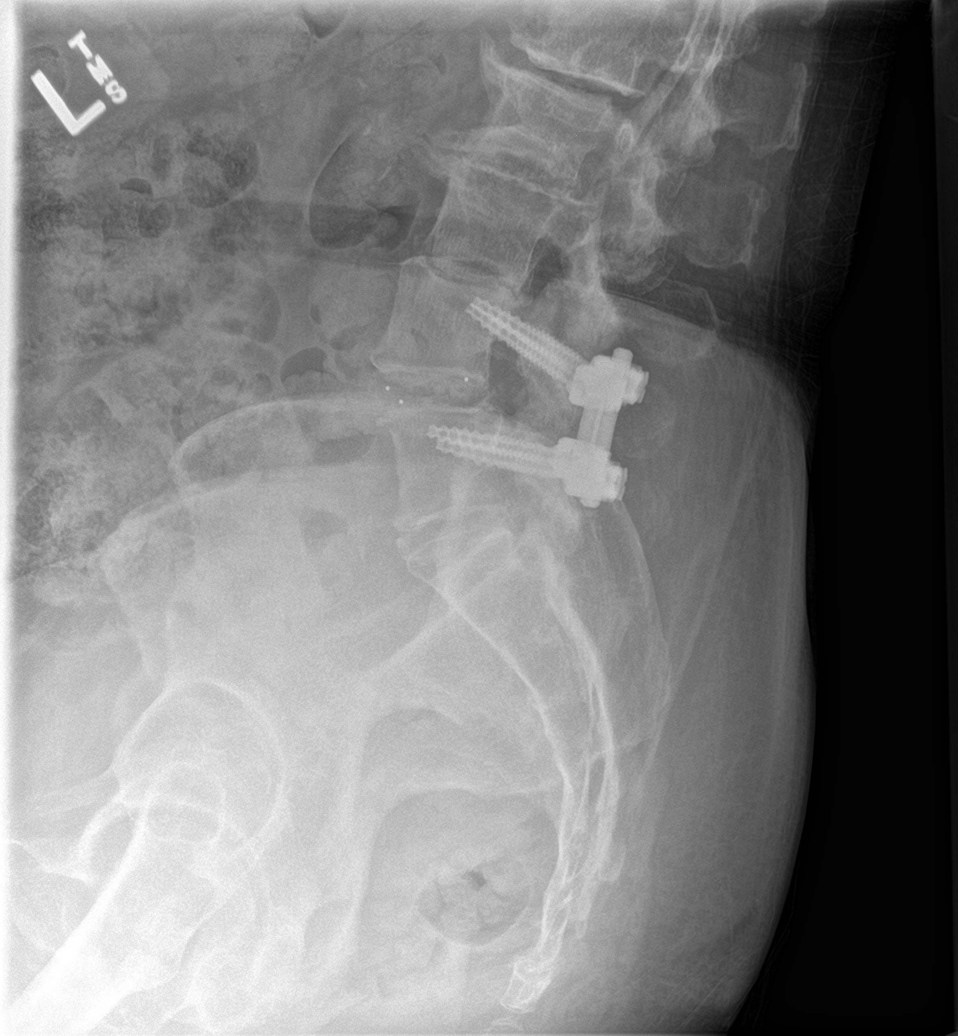

[5 of 5 positions shown; findings below may reference images not displayed]

FINDINGS: No recent fracture is seen. Levoscoliosis is seen. There is minimal
anterolisthesis at L4-L5 level. There is previous surgical fusion at
L4-L5 level. Last lumbar vertebra is transitional. Degenerative
changes are noted with disc space narrowing, bony spurs and facet
hypertrophy throughout lumbar spine. Surgical clips are seen in
gallbladder fossa.
IMPRESSION: No recent fracture is seen. There is posterior surgical fusion at
L4-L5 level. Lumbar spondylosis with disc space narrowing, bony
spurs and facet hypertrophy at multiple levels.

## 2022-11-10 ENCOUNTER — Other Ambulatory Visit (HOSPITAL_BASED_OUTPATIENT_CLINIC_OR_DEPARTMENT_OTHER): Payer: Self-pay | Admitting: Family Medicine

## 2022-11-10 DIAGNOSIS — Z13820 Encounter for screening for osteoporosis: Secondary | ICD-10-CM

## 2022-11-10 DIAGNOSIS — Z1231 Encounter for screening mammogram for malignant neoplasm of breast: Secondary | ICD-10-CM

## 2022-11-12 ENCOUNTER — Telehealth (HOSPITAL_BASED_OUTPATIENT_CLINIC_OR_DEPARTMENT_OTHER): Payer: Self-pay

## 2022-11-25 ENCOUNTER — Ambulatory Visit (HOSPITAL_BASED_OUTPATIENT_CLINIC_OR_DEPARTMENT_OTHER)
Admission: RE | Admit: 2022-11-25 | Discharge: 2022-11-25 | Disposition: A | Discharge status: Home / Self Care | Payer: Medicare PPO | Source: Ambulatory Visit | Attending: Family Medicine | Admitting: Family Medicine

## 2022-11-25 ENCOUNTER — Encounter (HOSPITAL_BASED_OUTPATIENT_CLINIC_OR_DEPARTMENT_OTHER): Payer: Self-pay

## 2022-11-25 DIAGNOSIS — E559 Vitamin D deficiency, unspecified: Secondary | ICD-10-CM | POA: Diagnosis not present

## 2022-11-25 DIAGNOSIS — Z1231 Encounter for screening mammogram for malignant neoplasm of breast: Secondary | ICD-10-CM | POA: Diagnosis present

## 2022-11-25 DIAGNOSIS — Z13820 Encounter for screening for osteoporosis: Secondary | ICD-10-CM | POA: Diagnosis present

## 2022-11-25 DIAGNOSIS — Z780 Asymptomatic menopausal state: Secondary | ICD-10-CM | POA: Insufficient documentation

## 2022-11-25 DIAGNOSIS — Z8262 Family history of osteoporosis: Secondary | ICD-10-CM | POA: Insufficient documentation

## 2022-11-26 ENCOUNTER — Ambulatory Visit (HOSPITAL_BASED_OUTPATIENT_CLINIC_OR_DEPARTMENT_OTHER): Payer: Medicare PPO

## 2023-10-26 ENCOUNTER — Other Ambulatory Visit (HOSPITAL_BASED_OUTPATIENT_CLINIC_OR_DEPARTMENT_OTHER): Payer: Self-pay | Admitting: Family Medicine

## 2023-10-26 DIAGNOSIS — Z1231 Encounter for screening mammogram for malignant neoplasm of breast: Secondary | ICD-10-CM

## 2023-10-29 ENCOUNTER — Other Ambulatory Visit (HOSPITAL_COMMUNITY): Payer: Self-pay

## 2023-12-02 ENCOUNTER — Ambulatory Visit (HOSPITAL_BASED_OUTPATIENT_CLINIC_OR_DEPARTMENT_OTHER)

## 2023-12-09 ENCOUNTER — Ambulatory Visit (HOSPITAL_BASED_OUTPATIENT_CLINIC_OR_DEPARTMENT_OTHER)
Admission: RE | Admit: 2023-12-09 | Discharge: 2023-12-09 | Disposition: A | Discharge status: Home / Self Care | Source: Ambulatory Visit | Attending: Family Medicine | Admitting: Family Medicine

## 2023-12-09 ENCOUNTER — Encounter (HOSPITAL_BASED_OUTPATIENT_CLINIC_OR_DEPARTMENT_OTHER): Payer: Self-pay

## 2023-12-09 DIAGNOSIS — Z1231 Encounter for screening mammogram for malignant neoplasm of breast: Secondary | ICD-10-CM | POA: Insufficient documentation

## 2023-12-15 ENCOUNTER — Encounter (HOSPITAL_BASED_OUTPATIENT_CLINIC_OR_DEPARTMENT_OTHER): Payer: Self-pay
